# Patient Record
Sex: Female | Born: 1935 | Race: White | Hispanic: No | Marital: Single | Smoking: Never smoker
Health system: Southern US, Community
[De-identification: ages and names within clinical notes are randomized; demographics above are authoritative.]

## PROBLEM LIST (undated history)

## (undated) DIAGNOSIS — N261 Atrophy of kidney (terminal): Secondary | ICD-10-CM

## (undated) DIAGNOSIS — E039 Hypothyroidism, unspecified: Secondary | ICD-10-CM

## (undated) DIAGNOSIS — I10 Essential (primary) hypertension: Secondary | ICD-10-CM

## (undated) DIAGNOSIS — N951 Menopausal and female climacteric states: Secondary | ICD-10-CM

## (undated) DIAGNOSIS — C4491 Basal cell carcinoma of skin, unspecified: Secondary | ICD-10-CM

## (undated) DIAGNOSIS — C801 Malignant (primary) neoplasm, unspecified: Secondary | ICD-10-CM

## (undated) DIAGNOSIS — F329 Major depressive disorder, single episode, unspecified: Secondary | ICD-10-CM

## (undated) DIAGNOSIS — S42309A Unspecified fracture of shaft of humerus, unspecified arm, initial encounter for closed fracture: Secondary | ICD-10-CM

## (undated) DIAGNOSIS — K5792 Diverticulitis of intestine, part unspecified, without perforation or abscess without bleeding: Secondary | ICD-10-CM

## (undated) DIAGNOSIS — F419 Anxiety disorder, unspecified: Secondary | ICD-10-CM

## (undated) DIAGNOSIS — F32A Depression, unspecified: Secondary | ICD-10-CM

## (undated) DIAGNOSIS — I129 Hypertensive chronic kidney disease with stage 1 through stage 4 chronic kidney disease, or unspecified chronic kidney disease: Secondary | ICD-10-CM

## (undated) DIAGNOSIS — N19 Unspecified kidney failure: Secondary | ICD-10-CM

## (undated) HISTORY — DX: Depression, unspecified: F32.A

## (undated) HISTORY — PX: CATARACT EXTRACTION: SUR2

## (undated) HISTORY — DX: Atrophy of kidney (terminal): N26.1

## (undated) HISTORY — DX: Basal cell carcinoma of skin, unspecified: C44.91

## (undated) HISTORY — DX: Essential (primary) hypertension: I10

## (undated) HISTORY — DX: Hypothyroidism, unspecified: E03.9

## (undated) HISTORY — DX: Diverticulitis of intestine, part unspecified, without perforation or abscess without bleeding: K57.92

## (undated) HISTORY — DX: Unspecified kidney failure: N19

## (undated) HISTORY — DX: Malignant (primary) neoplasm, unspecified: C80.1

## (undated) HISTORY — DX: Hypertensive chronic kidney disease with stage 1 through stage 4 chronic kidney disease, or unspecified chronic kidney disease: I12.9

## (undated) HISTORY — DX: Anxiety disorder, unspecified: F41.9

## (undated) HISTORY — DX: Major depressive disorder, single episode, unspecified: F32.9

## (undated) HISTORY — DX: Menopausal and female climacteric states: N95.1

## (undated) HISTORY — PX: COLONOSCOPY: SHX174

## (undated) HISTORY — DX: Unspecified fracture of shaft of humerus, unspecified arm, initial encounter for closed fracture: S42.309A

---

## 2005-04-29 DIAGNOSIS — C801 Malignant (primary) neoplasm, unspecified: Secondary | ICD-10-CM

## 2005-04-29 HISTORY — PX: OTHER SURGICAL HISTORY: SHX169

## 2005-04-29 HISTORY — PX: PORTACATH PLACEMENT: SHX2246

## 2005-04-29 HISTORY — DX: Malignant (primary) neoplasm, unspecified: C80.1

## 2005-09-12 ENCOUNTER — Inpatient Hospital Stay: Payer: Self-pay | Admitting: General Surgery

## 2005-09-12 ENCOUNTER — Other Ambulatory Visit: Payer: Self-pay

## 2005-09-25 ENCOUNTER — Inpatient Hospital Stay: Payer: Self-pay | Admitting: General Surgery

## 2005-10-04 ENCOUNTER — Ambulatory Visit: Payer: Self-pay | Admitting: General Surgery

## 2005-10-09 ENCOUNTER — Ambulatory Visit: Payer: Self-pay | Admitting: Oncology

## 2005-10-14 ENCOUNTER — Ambulatory Visit: Payer: Self-pay | Admitting: General Surgery

## 2005-10-27 ENCOUNTER — Ambulatory Visit: Payer: Self-pay | Admitting: Oncology

## 2005-11-28 ENCOUNTER — Ambulatory Visit: Payer: Self-pay | Admitting: Oncology

## 2005-12-08 ENCOUNTER — Inpatient Hospital Stay: Payer: Self-pay | Admitting: Oncology

## 2005-12-28 ENCOUNTER — Ambulatory Visit: Payer: Self-pay | Admitting: Oncology

## 2006-01-27 ENCOUNTER — Ambulatory Visit: Payer: Self-pay | Admitting: Oncology

## 2006-02-27 ENCOUNTER — Ambulatory Visit: Payer: Self-pay | Admitting: Oncology

## 2006-03-29 ENCOUNTER — Ambulatory Visit: Payer: Self-pay | Admitting: Oncology

## 2006-04-29 ENCOUNTER — Ambulatory Visit: Payer: Self-pay | Admitting: Oncology

## 2006-05-30 ENCOUNTER — Ambulatory Visit: Payer: Self-pay | Admitting: Oncology

## 2006-06-28 ENCOUNTER — Ambulatory Visit: Payer: Self-pay | Admitting: Oncology

## 2006-08-19 ENCOUNTER — Ambulatory Visit: Payer: Self-pay | Admitting: Oncology

## 2006-08-19 ENCOUNTER — Ambulatory Visit: Payer: Self-pay | Admitting: Internal Medicine

## 2006-08-28 ENCOUNTER — Ambulatory Visit: Payer: Self-pay | Admitting: Internal Medicine

## 2006-08-28 ENCOUNTER — Ambulatory Visit: Payer: Self-pay | Admitting: Oncology

## 2006-09-09 ENCOUNTER — Ambulatory Visit: Payer: Self-pay | Admitting: General Surgery

## 2006-09-30 ENCOUNTER — Ambulatory Visit: Payer: Self-pay | Admitting: Oncology

## 2006-10-28 ENCOUNTER — Ambulatory Visit: Payer: Self-pay | Admitting: Oncology

## 2006-10-28 ENCOUNTER — Ambulatory Visit: Payer: Self-pay | Admitting: Internal Medicine

## 2006-11-28 ENCOUNTER — Ambulatory Visit: Payer: Self-pay | Admitting: Internal Medicine

## 2006-11-28 ENCOUNTER — Ambulatory Visit: Payer: Self-pay | Admitting: Oncology

## 2006-12-30 ENCOUNTER — Ambulatory Visit: Payer: Self-pay | Admitting: Oncology

## 2007-01-28 ENCOUNTER — Ambulatory Visit: Payer: Self-pay | Admitting: Oncology

## 2007-02-28 ENCOUNTER — Ambulatory Visit: Payer: Self-pay | Admitting: Oncology

## 2007-03-30 ENCOUNTER — Ambulatory Visit: Payer: Self-pay | Admitting: Oncology

## 2007-04-30 ENCOUNTER — Ambulatory Visit: Payer: Self-pay | Admitting: Oncology

## 2007-04-30 HISTORY — PX: PORT-A-CATH REMOVAL: SHX5289

## 2007-05-31 ENCOUNTER — Ambulatory Visit: Payer: Self-pay | Admitting: Oncology

## 2007-06-28 ENCOUNTER — Ambulatory Visit: Payer: Self-pay | Admitting: Oncology

## 2007-07-13 LAB — HM DEXA SCAN

## 2007-07-27 IMAGING — CR DG ABDOMEN 3V
1 series · 6 of 6 positions shown · non-contrast
Comparison: none

REASON FOR EXAM: Abdominal pain
COMMENTS:  LMP: Post-Menopausal

[Series 1: view not recorded · 0.17mm/px · 6 of 6 slices shown]
[im 1/6]
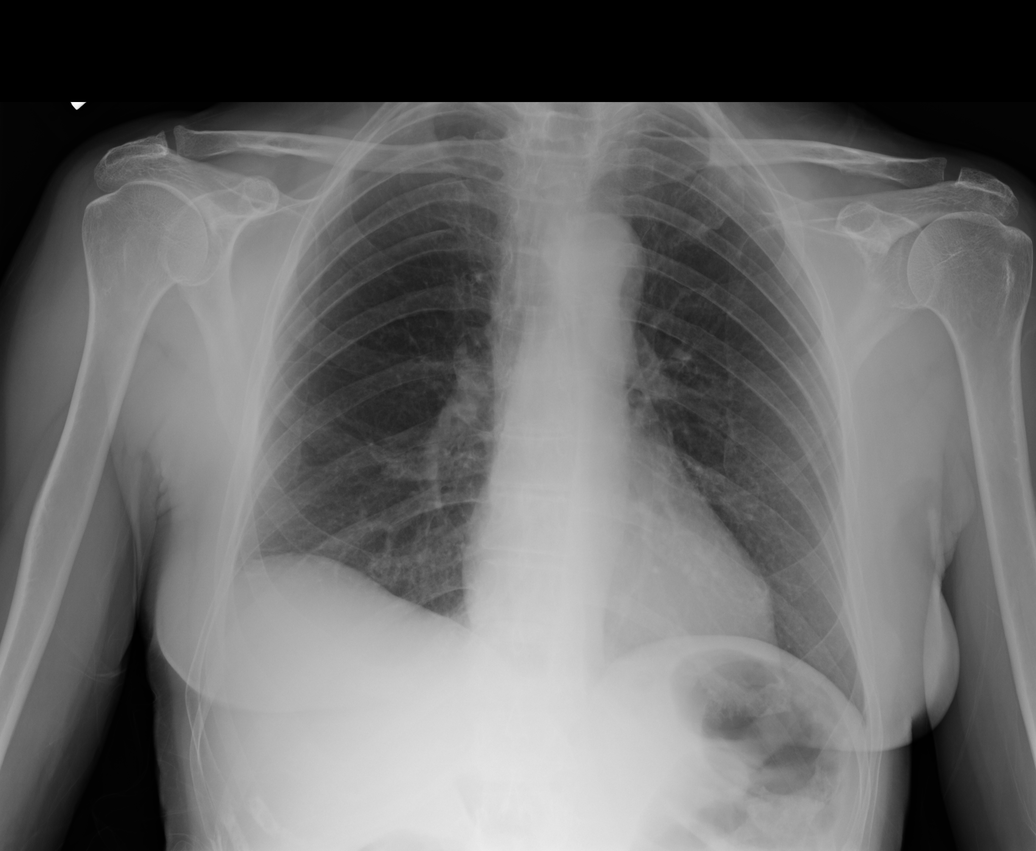
[im 2/6]
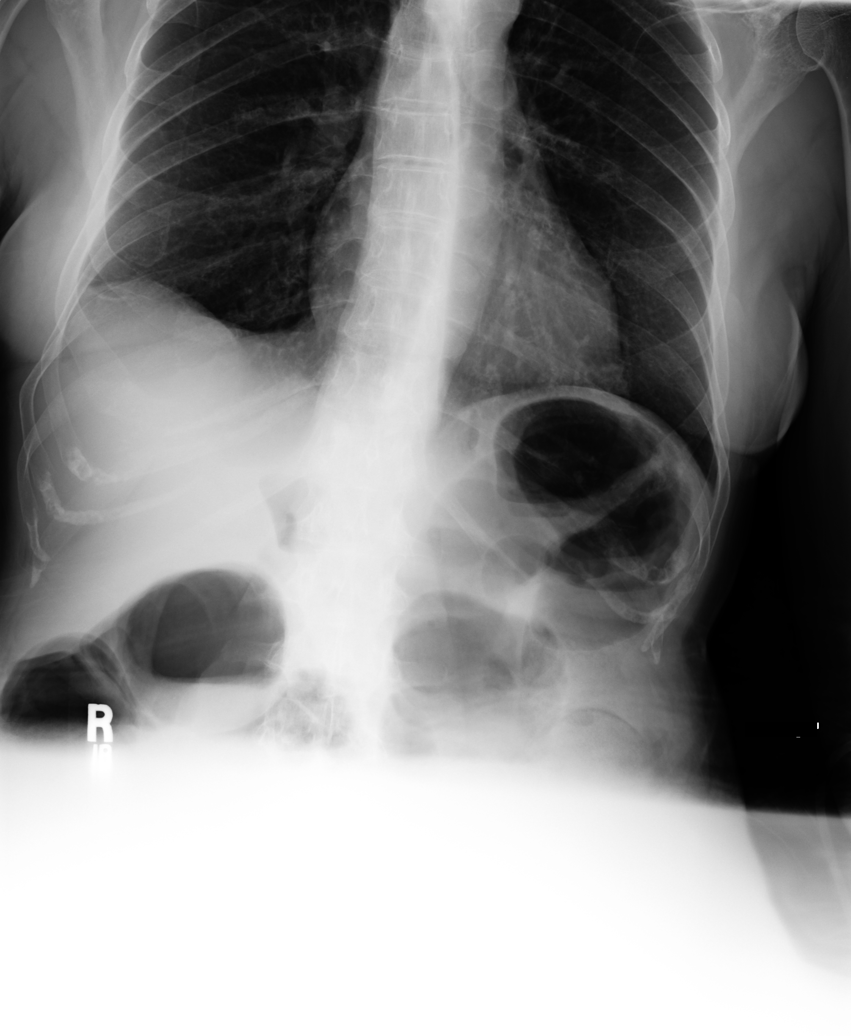
[im 3/6]
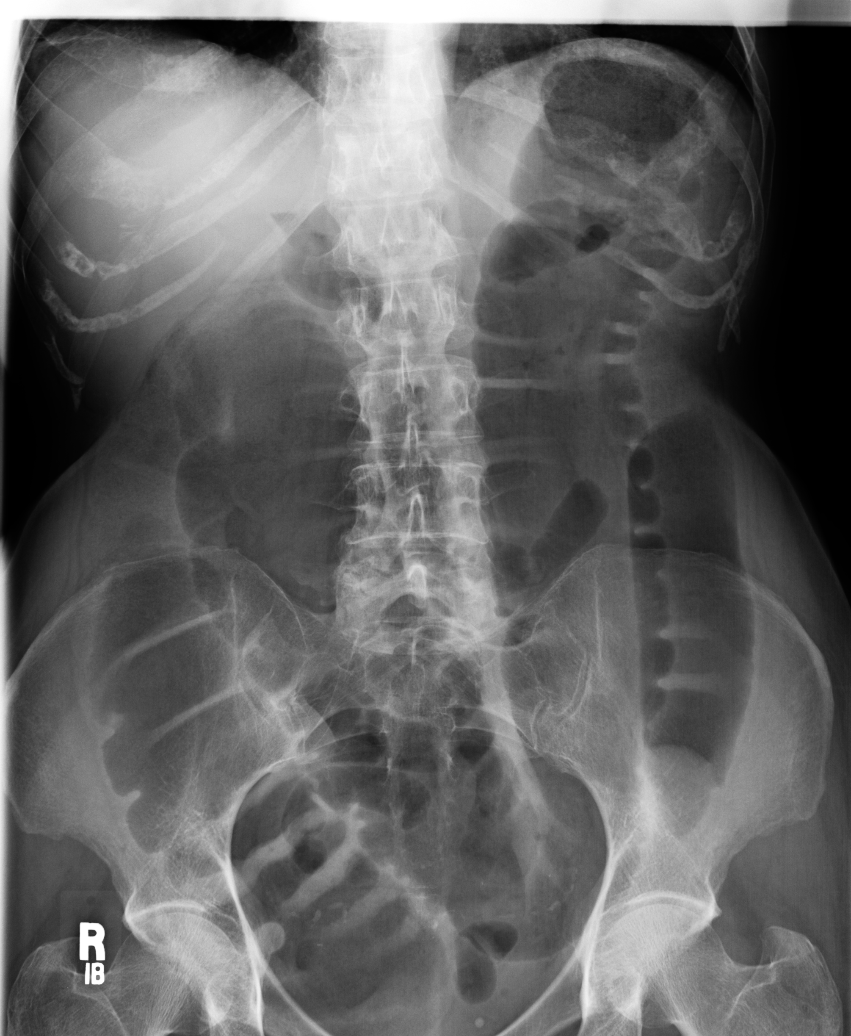
[im 4/6]
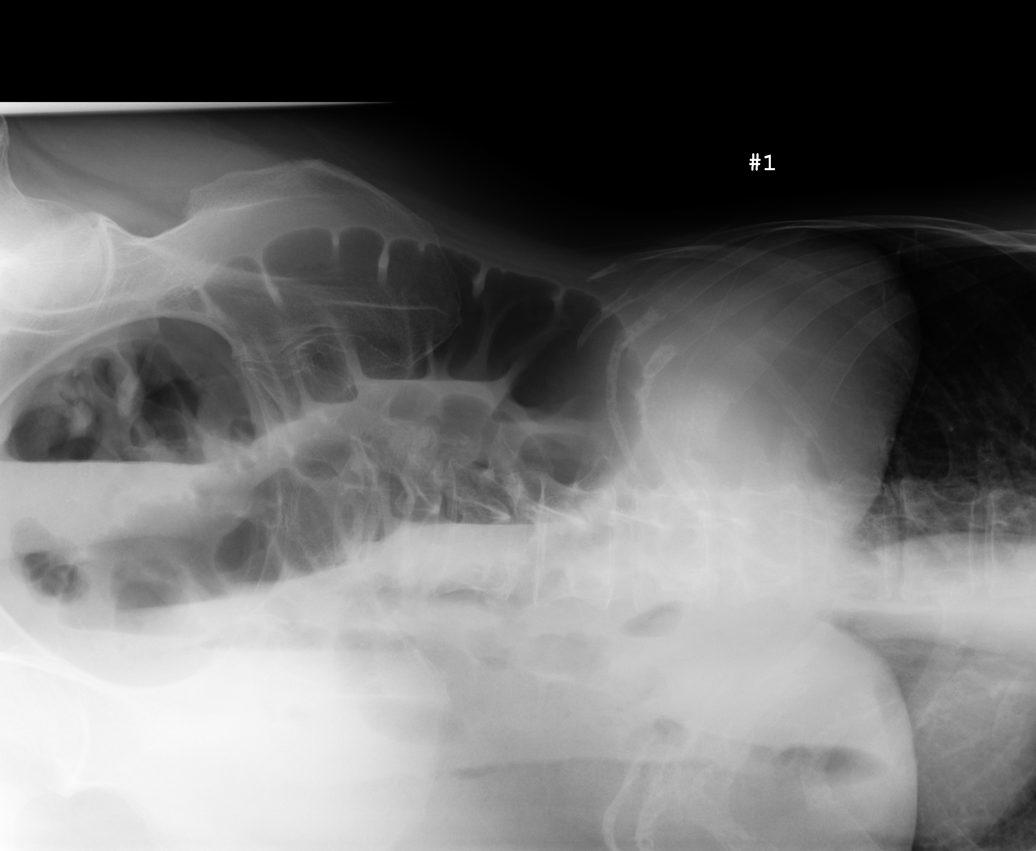
[im 5/6]
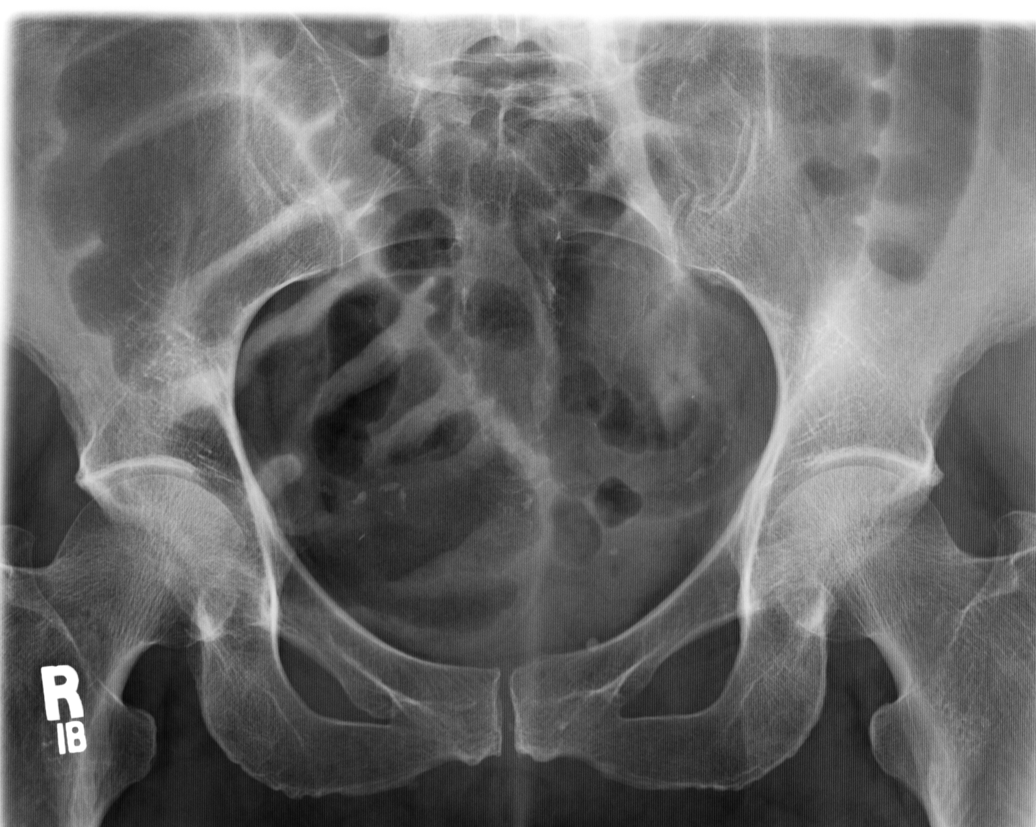
[im 6/6]
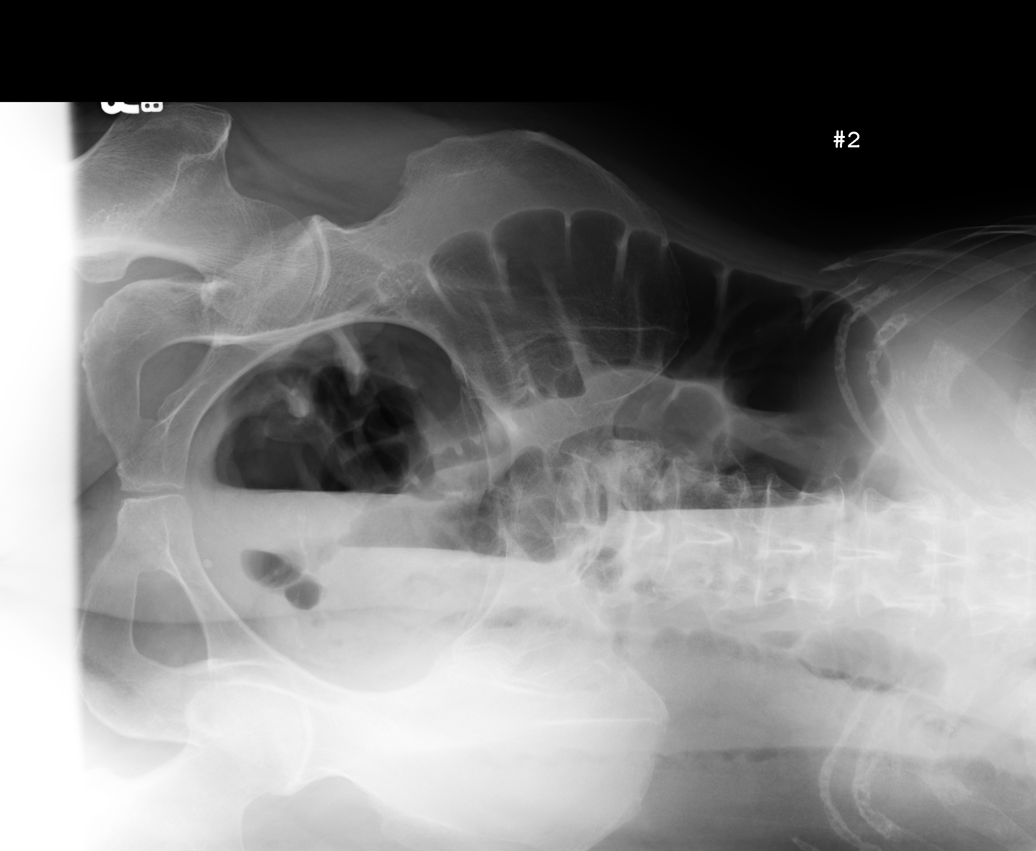

[6 of 6 positions shown; findings below may reference images not displayed]

PROCEDURE:     DXR - DXR ABDOMEN 3-WAY (INCL PA CXR)  - September 12, 2005  [DATE]

RESULT:        A single view of the chest shows mild elevation of the RIGHT
hemidiaphragm laterally.  Some air-fluid levels are seen within loops of
bowel that appear to be colon.  Small amount of air is seen in the rectum.
There appears to be decreased gas in the sigmoid relative to the rest of the
colon.  If the appearance does not resolve, then barium enema would be
recommended. Colonoscopy could also be considered.  No free air is seen.
IMPRESSION: Cannot exclude a low colonic partial obstruction.

## 2007-07-29 ENCOUNTER — Ambulatory Visit: Payer: Self-pay | Admitting: Oncology

## 2007-08-01 IMAGING — CR DG CHEST 1V PORT
1 series · 1 of 1 positions shown · non-contrast
Comparison: none

REASON FOR EXAM: hypoxia
COMMENTS:

[view not recorded]
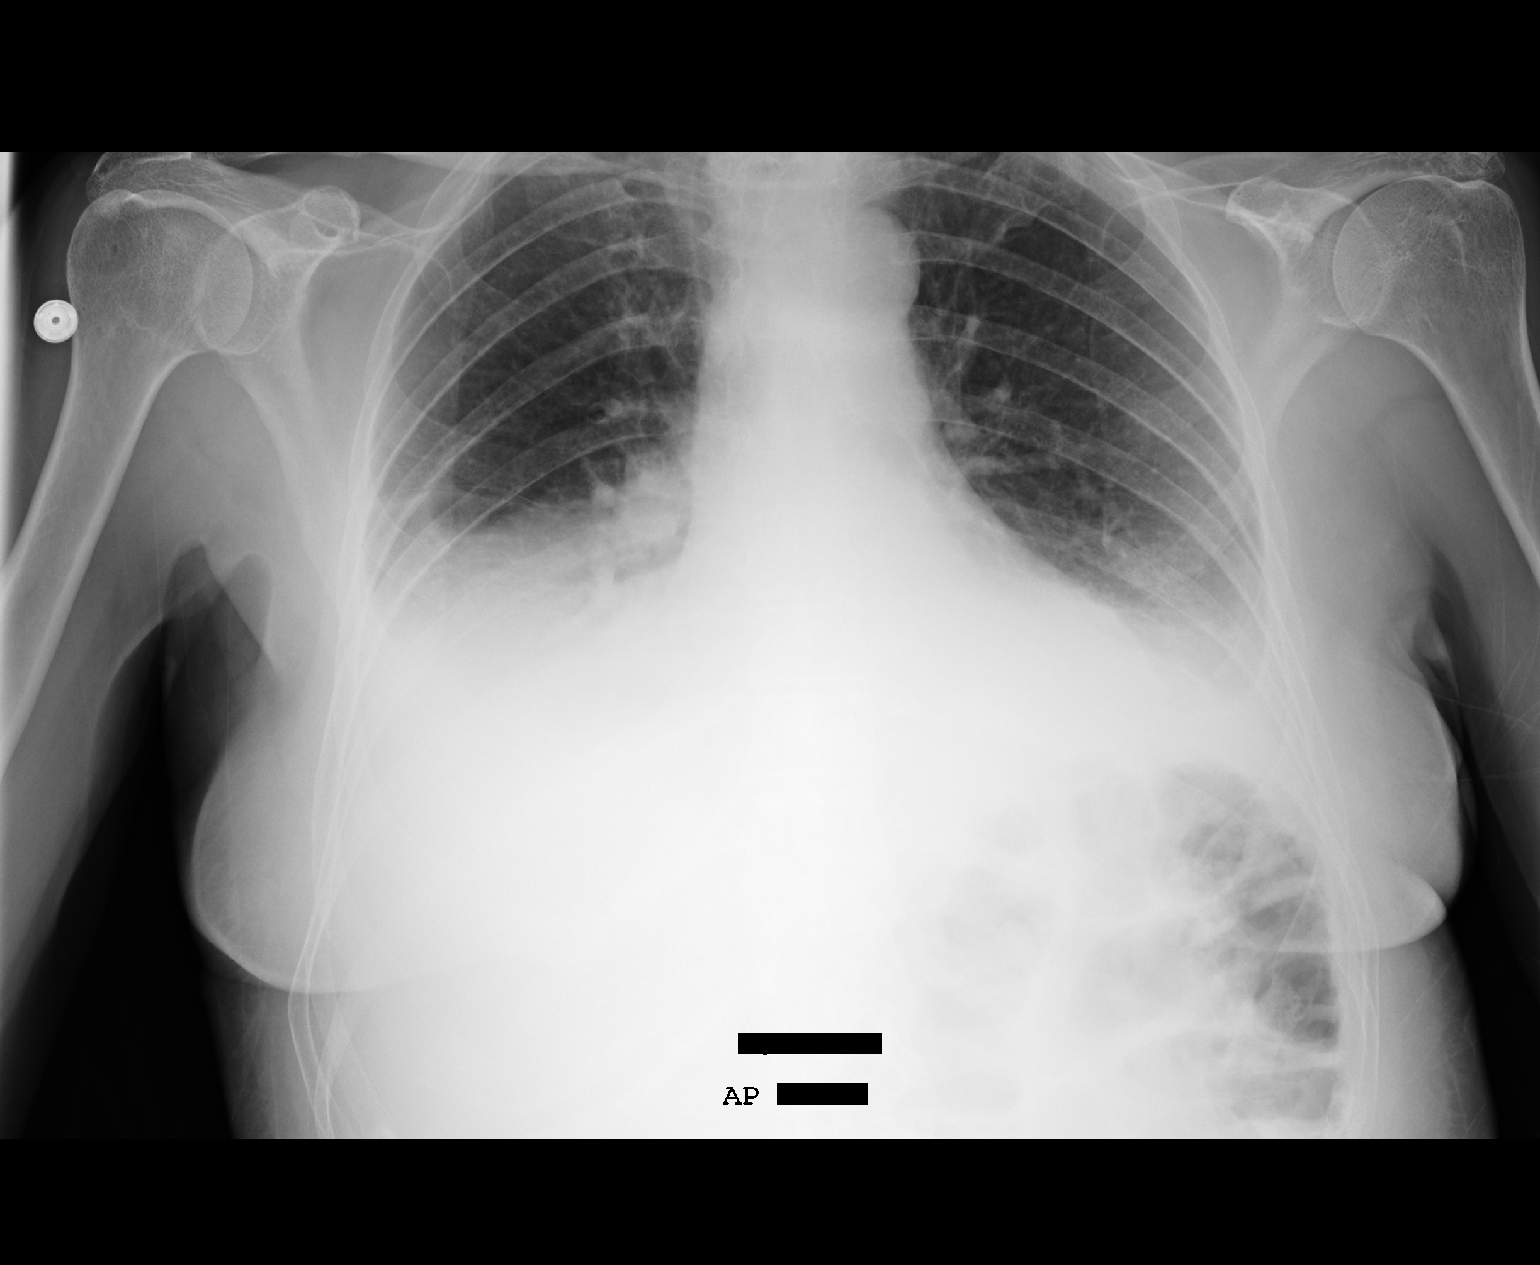

[1 of 1 positions shown; findings below may reference images not displayed]

PROCEDURE:     DXR - DXR PORTABLE CHEST SINGLE VIEW  - September 17, 2005 [DATE]

RESULT:     AP view of the chest shows increased density at both lung bases
compatible with bibasilar atelectasis with probable associated bibasilar
effusions. The effusions could be better evaluated by follow-up exam with a
lateral view.  The upper lobes are bilaterally clear.  Heart size is normal.
No pulmonary edema is seen.
IMPRESSION: 1)There is increased density at both lung bases, compatible with bibasilar
atelectasis.  Underlying effusions or underlying pneumonia cannot be
excluded. Continued follow-up is recommended.

## 2007-08-04 IMAGING — CR DG ABDOMEN 2V
1 series · 2 of 2 positions shown · non-contrast
Comparison: none

REASON FOR EXAM: Abdominal distension s/p sigmoid resection. PLEASE DO BY
10 AM. Thanks.
COMMENTS:

[Series 1: view not recorded · 0.17mm/px · 2 of 2 slices shown]
[im 1/2]
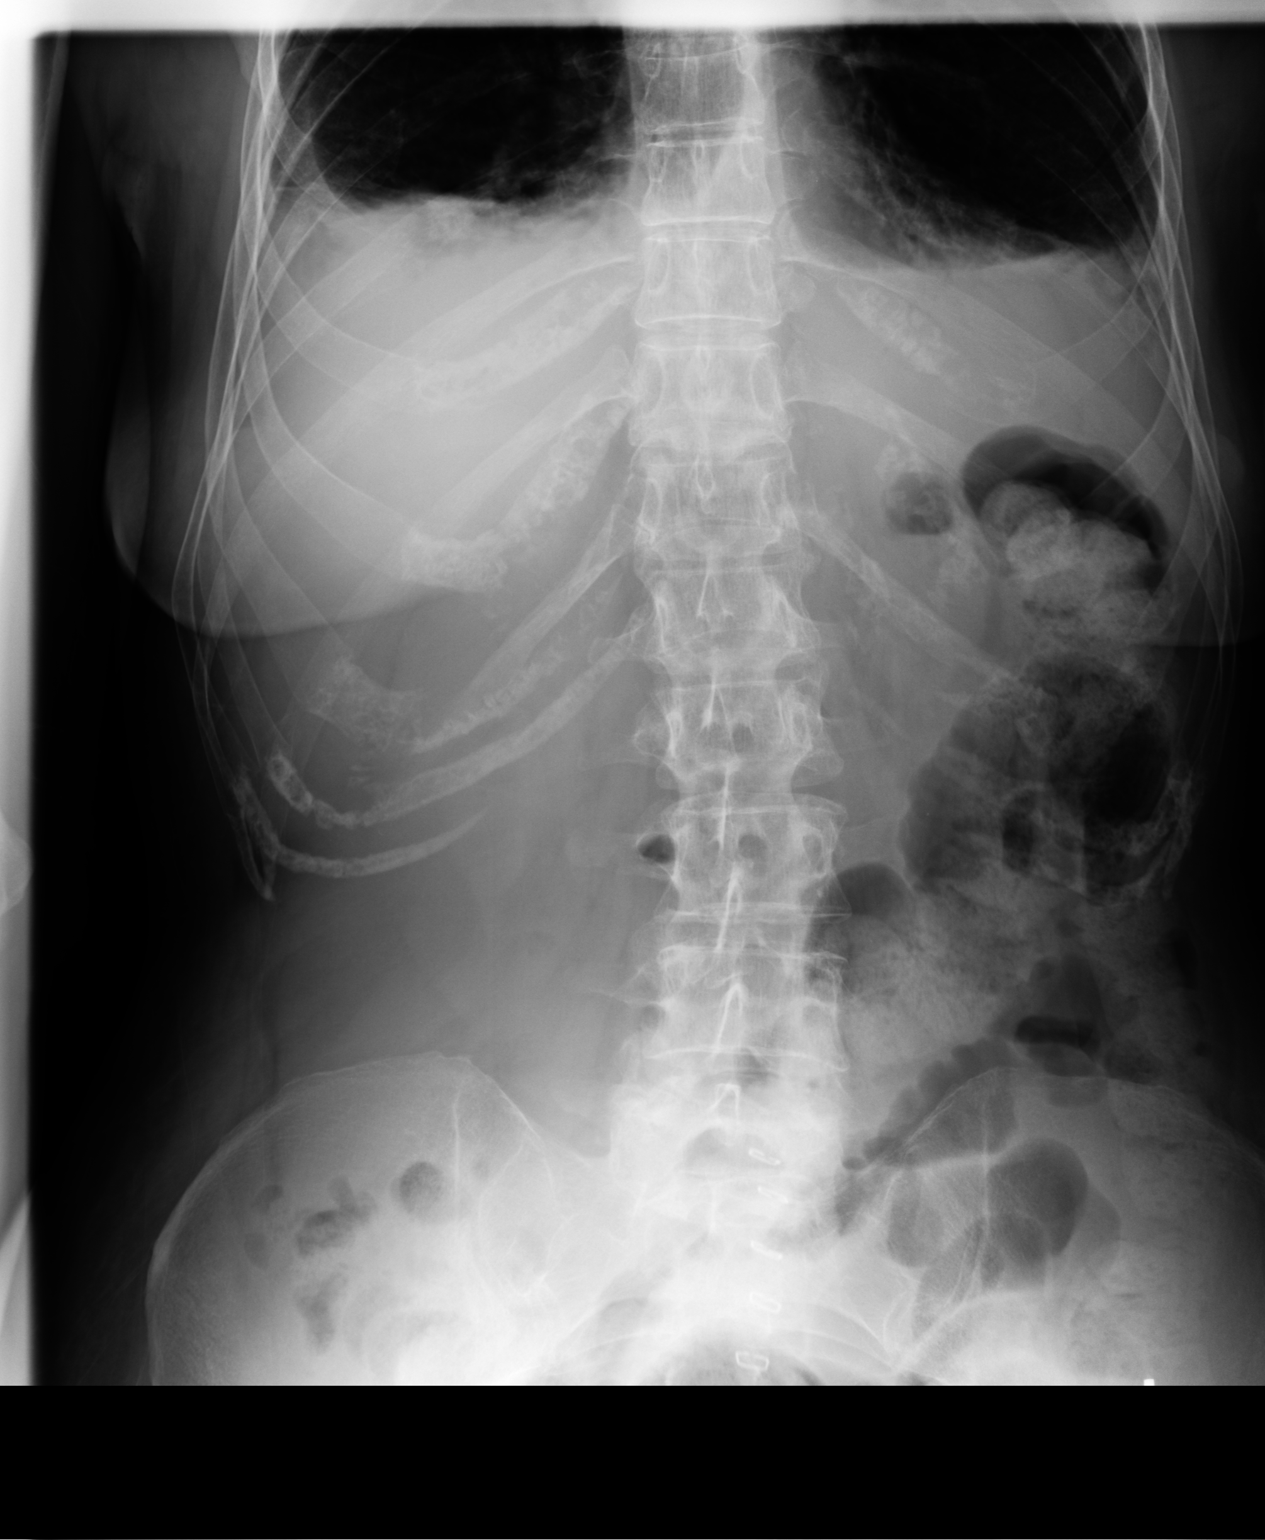
[im 2/2]
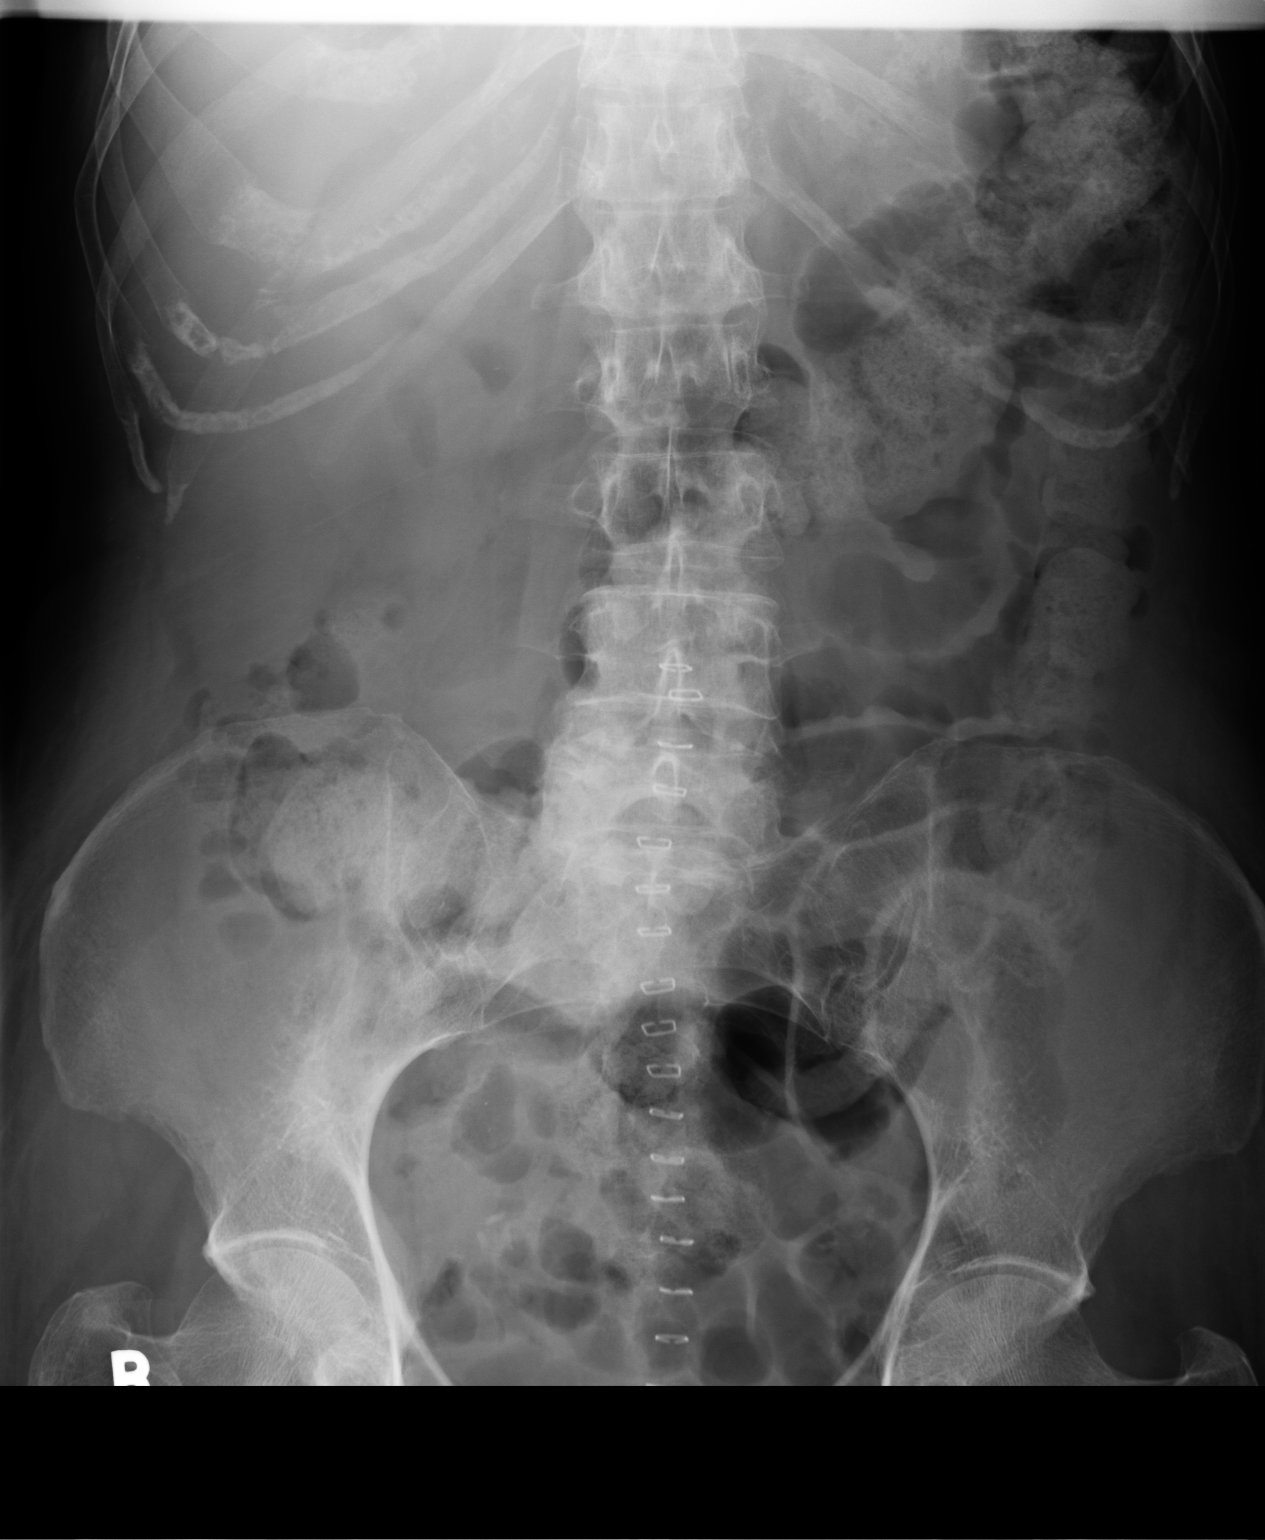

[2 of 2 positions shown; findings below may reference images not displayed]

PROCEDURE:     DXR - DXR ABDOMEN 2 V FLAT AND ERECT  - September 20, 2005  [DATE]

RESULT:     Flat and erect views of the abdomen are compared to a prior exam
of 09-13-05.  The current exam shows midline pelvic metallic wire sutures.
The bowel gas pattern on the current exam is normal in appearance. The bowel
dilatation present on the prior exam of 09-13-05 is no longer seen.  Fecal
material is noted in the colon.  No abnormal intraabdominal calcifications
are seen.
IMPRESSION: 1)No specific abnormalities are noted.

## 2007-08-10 IMAGING — US US GUIDE NEEDLE - US [PERSON_NAME]
1 series · 8 of 8 positions shown · non-contrast
Comparison: none

REASON FOR EXAM: Right pleural effusion. Send for culture, cell count,
glucose, LDH, gram stain.
COMMENTS:

PROCEDURE:     US  - US GUIDED THORACENTESIS RIGHT  - September 26, 2005 [DATE]
RESULT:
HISTORY: RIGHT pleural effusion.

[Series 1: us guide needle - us (person_name) · 0.38mm/px · 8 of 8 slices shown]
[im 1/8]
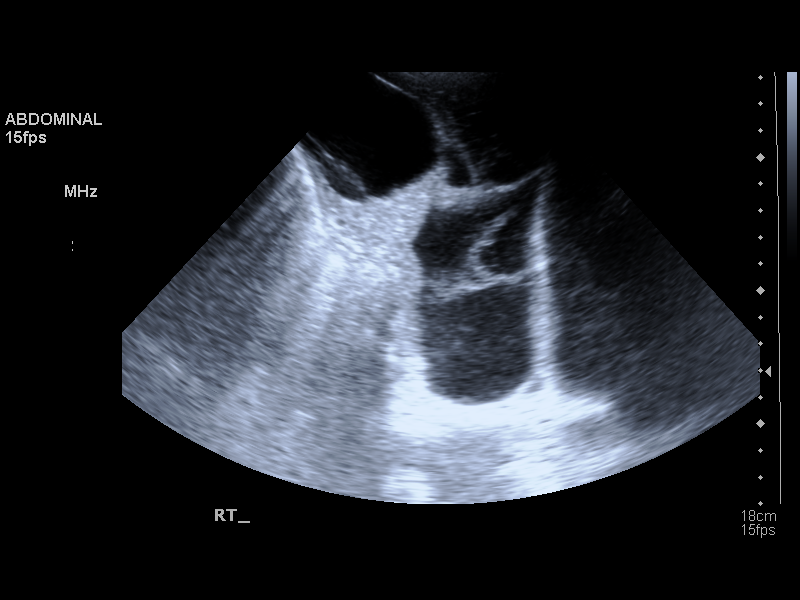
[im 2/8]
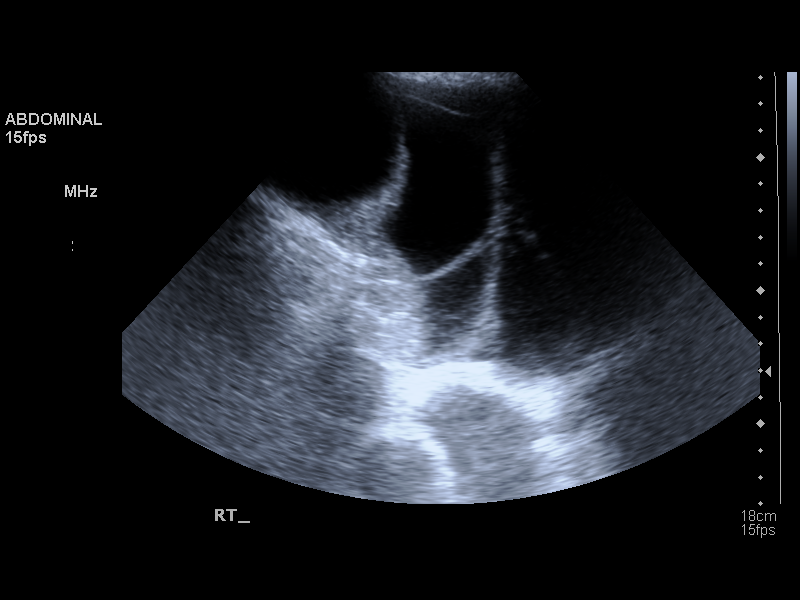
[im 3/8]
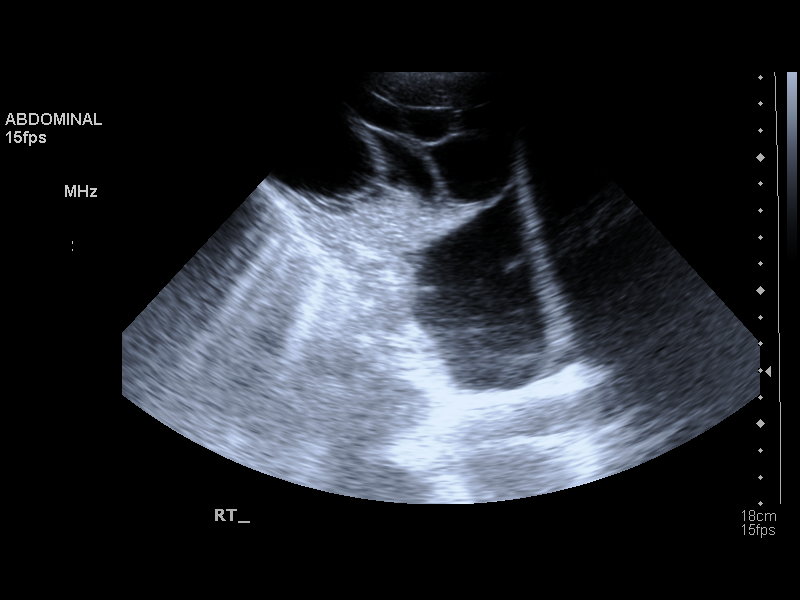
[im 4/8]
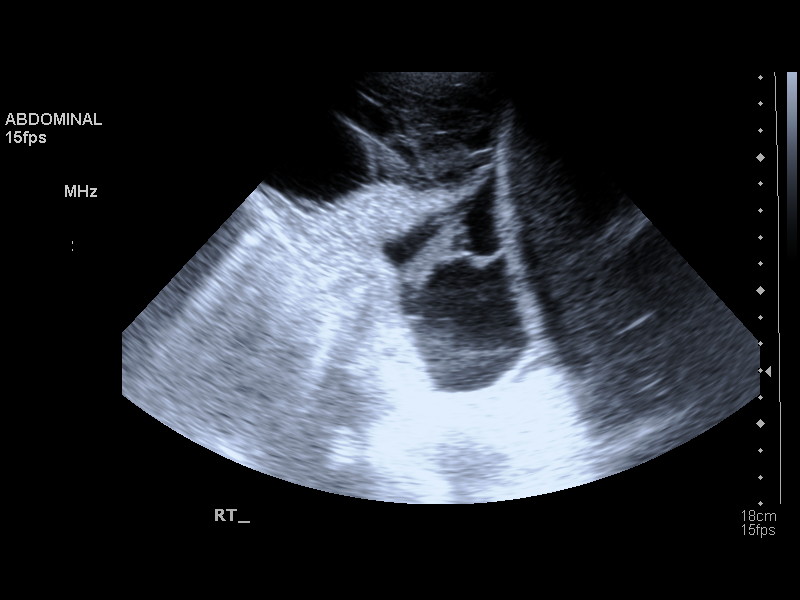
[im 5/8]
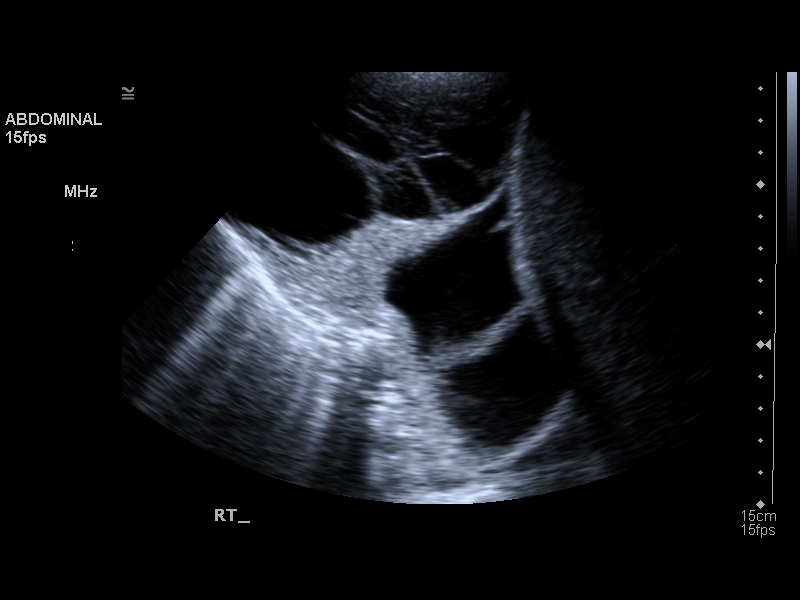
[im 6/8]
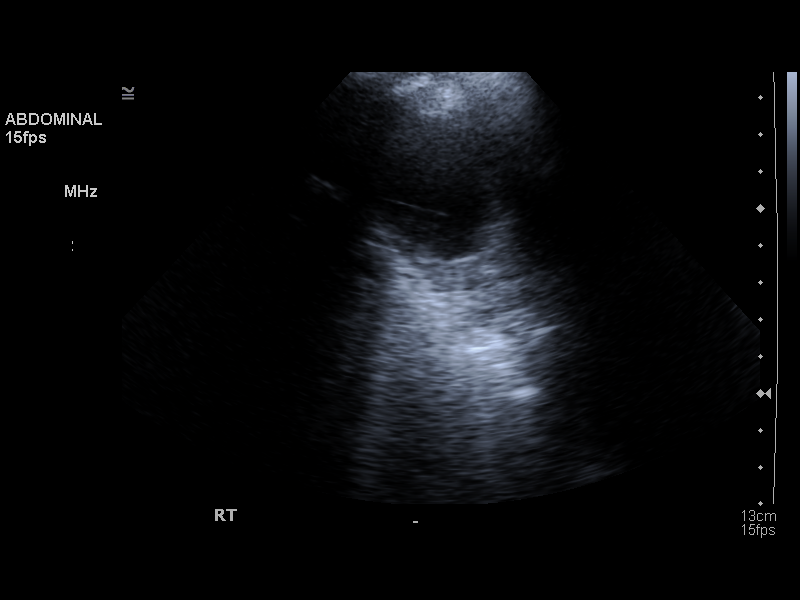
[im 7/8]
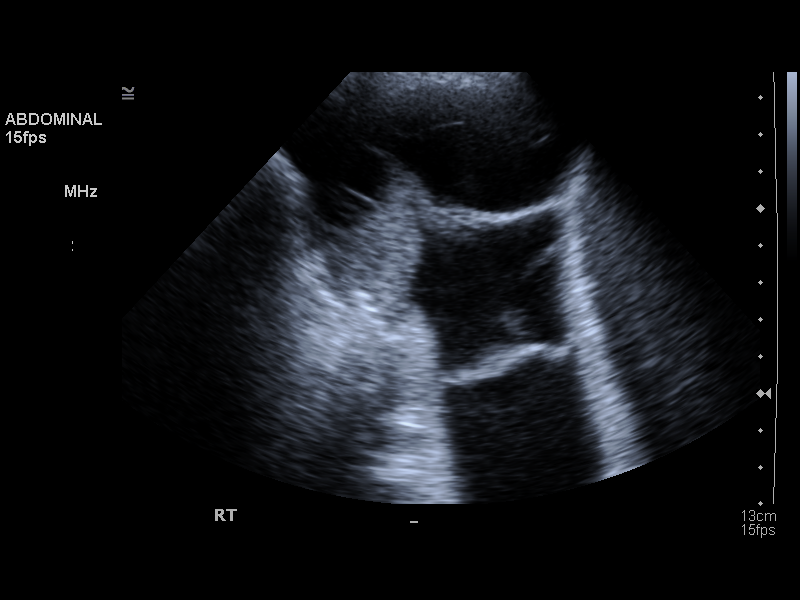
[im 8/8]
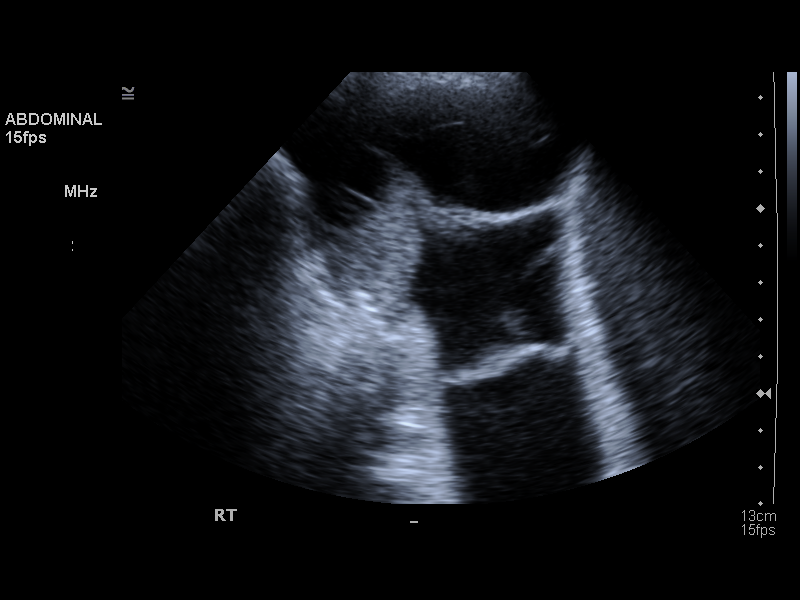

[8 of 8 positions shown; findings below may reference images not displayed]

PROCEDURE AND FINDINGS:   Ultrasound was performed and reveals a severely
loculated thick septated fluid collection in the RIGHT pleural space.  The
patient was sterilely prepped and draped.  Local anesthesia was administered
with 1% Lidocaine.  The largest collection of fluid was localized and a
14-gauge Jelco needle was advanced into the fluid collection and 200 ml of
serosanguineous fluid removed.
IMPRESSION: 1.     Successful ultrasound-directed thoracentesis.  Fluid sample sent to
the ordered labs.
2.     It should be noted that the pleural fluid collection is extremely
loculated with multiple thick septations.

## 2007-08-28 ENCOUNTER — Ambulatory Visit: Payer: Self-pay | Admitting: Oncology

## 2007-09-29 ENCOUNTER — Ambulatory Visit: Payer: Self-pay | Admitting: Oncology

## 2007-10-28 ENCOUNTER — Ambulatory Visit: Payer: Self-pay | Admitting: Oncology

## 2007-11-19 ENCOUNTER — Ambulatory Visit: Payer: Self-pay | Admitting: Oncology

## 2007-11-28 ENCOUNTER — Ambulatory Visit: Payer: Self-pay | Admitting: Oncology

## 2008-01-07 ENCOUNTER — Ambulatory Visit: Payer: Self-pay | Admitting: Oncology

## 2008-01-28 ENCOUNTER — Ambulatory Visit: Payer: Self-pay | Admitting: Oncology

## 2008-02-28 ENCOUNTER — Ambulatory Visit: Payer: Self-pay | Admitting: Oncology

## 2008-03-29 ENCOUNTER — Ambulatory Visit: Payer: Self-pay | Admitting: Oncology

## 2008-07-28 ENCOUNTER — Ambulatory Visit: Payer: Self-pay | Admitting: Oncology

## 2008-08-27 ENCOUNTER — Ambulatory Visit: Payer: Self-pay | Admitting: Oncology

## 2009-01-27 ENCOUNTER — Ambulatory Visit: Payer: Self-pay | Admitting: Nurse Practitioner

## 2009-02-02 ENCOUNTER — Ambulatory Visit: Payer: Self-pay | Admitting: Oncology

## 2009-02-27 ENCOUNTER — Ambulatory Visit: Payer: Self-pay | Admitting: Oncology

## 2009-02-27 ENCOUNTER — Ambulatory Visit: Payer: Self-pay | Admitting: Nurse Practitioner

## 2009-04-12 ENCOUNTER — Ambulatory Visit: Payer: Self-pay | Admitting: Oncology

## 2009-04-29 ENCOUNTER — Ambulatory Visit: Payer: Self-pay | Admitting: Oncology

## 2009-06-13 ENCOUNTER — Ambulatory Visit: Payer: Self-pay | Admitting: Ophthalmology

## 2009-06-26 ENCOUNTER — Ambulatory Visit: Payer: Self-pay | Admitting: Ophthalmology

## 2009-07-28 ENCOUNTER — Ambulatory Visit: Payer: Self-pay | Admitting: Oncology

## 2009-08-03 ENCOUNTER — Ambulatory Visit: Payer: Self-pay | Admitting: Oncology

## 2009-08-11 ENCOUNTER — Ambulatory Visit: Payer: Self-pay | Admitting: General Surgery

## 2009-08-27 ENCOUNTER — Ambulatory Visit: Payer: Self-pay | Admitting: Oncology

## 2009-10-09 ENCOUNTER — Ambulatory Visit: Payer: Self-pay | Admitting: Ophthalmology

## 2010-01-27 ENCOUNTER — Ambulatory Visit: Payer: Self-pay | Admitting: Oncology

## 2010-02-08 ENCOUNTER — Ambulatory Visit: Payer: Self-pay | Admitting: Oncology

## 2010-02-10 LAB — CEA: CEA: 3.4 ng/mL (ref 0.0–4.7)

## 2010-02-27 ENCOUNTER — Ambulatory Visit: Payer: Self-pay | Admitting: Oncology

## 2010-08-20 ENCOUNTER — Ambulatory Visit: Payer: Self-pay | Admitting: Oncology

## 2010-08-21 LAB — CEA: CEA: 3.3 ng/mL (ref 0.0–4.7)

## 2010-08-28 ENCOUNTER — Ambulatory Visit: Payer: Self-pay | Admitting: Oncology

## 2011-02-19 ENCOUNTER — Ambulatory Visit: Payer: Self-pay | Admitting: Oncology

## 2011-02-28 ENCOUNTER — Ambulatory Visit: Payer: Self-pay | Admitting: Oncology

## 2011-07-19 ENCOUNTER — Observation Stay: Payer: Self-pay | Admitting: Internal Medicine

## 2011-07-19 DIAGNOSIS — E039 Hypothyroidism, unspecified: Secondary | ICD-10-CM | POA: Diagnosis not present

## 2011-07-19 DIAGNOSIS — R079 Chest pain, unspecified: Secondary | ICD-10-CM | POA: Diagnosis not present

## 2011-07-19 DIAGNOSIS — Z79899 Other long term (current) drug therapy: Secondary | ICD-10-CM | POA: Diagnosis not present

## 2011-07-19 DIAGNOSIS — N179 Acute kidney failure, unspecified: Secondary | ICD-10-CM | POA: Diagnosis not present

## 2011-07-19 DIAGNOSIS — I519 Heart disease, unspecified: Secondary | ICD-10-CM | POA: Diagnosis not present

## 2011-07-19 DIAGNOSIS — Z8249 Family history of ischemic heart disease and other diseases of the circulatory system: Secondary | ICD-10-CM | POA: Diagnosis not present

## 2011-07-19 DIAGNOSIS — D72829 Elevated white blood cell count, unspecified: Secondary | ICD-10-CM | POA: Diagnosis not present

## 2011-07-19 DIAGNOSIS — Z9049 Acquired absence of other specified parts of digestive tract: Secondary | ICD-10-CM | POA: Diagnosis not present

## 2011-07-19 DIAGNOSIS — F17200 Nicotine dependence, unspecified, uncomplicated: Secondary | ICD-10-CM | POA: Diagnosis not present

## 2011-07-19 DIAGNOSIS — E860 Dehydration: Secondary | ICD-10-CM | POA: Diagnosis not present

## 2011-07-19 DIAGNOSIS — R52 Pain, unspecified: Secondary | ICD-10-CM | POA: Diagnosis not present

## 2011-07-19 DIAGNOSIS — D649 Anemia, unspecified: Secondary | ICD-10-CM | POA: Diagnosis not present

## 2011-07-19 DIAGNOSIS — I959 Hypotension, unspecified: Secondary | ICD-10-CM | POA: Diagnosis not present

## 2011-07-19 DIAGNOSIS — I10 Essential (primary) hypertension: Secondary | ICD-10-CM | POA: Diagnosis not present

## 2011-07-19 DIAGNOSIS — R0789 Other chest pain: Secondary | ICD-10-CM | POA: Diagnosis not present

## 2011-07-19 DIAGNOSIS — Z85038 Personal history of other malignant neoplasm of large intestine: Secondary | ICD-10-CM | POA: Diagnosis not present

## 2011-07-19 DIAGNOSIS — F411 Generalized anxiety disorder: Secondary | ICD-10-CM | POA: Diagnosis not present

## 2011-07-19 LAB — CBC
HCT: 36.4 % (ref 35.0–47.0)
MCHC: 34.3 g/dL (ref 32.0–36.0)
Platelet: 156 10*3/uL (ref 150–440)
RBC: 3.86 10*6/uL (ref 3.80–5.20)
RDW: 12.8 % (ref 11.5–14.5)
WBC: 17.4 10*3/uL — ABNORMAL HIGH (ref 3.6–11.0)

## 2011-07-19 LAB — PRO B NATRIURETIC PEPTIDE: B-Type Natriuretic Peptide: 96 pg/mL (ref 0–450)

## 2011-07-19 LAB — CK TOTAL AND CKMB (NOT AT ARMC)
CK, Total: 54 U/L (ref 21–215)
CK-MB: 0.6 ng/mL (ref 0.5–3.6)

## 2011-07-19 LAB — COMPREHENSIVE METABOLIC PANEL
Alkaline Phosphatase: 60 U/L (ref 50–136)
BUN: 21 mg/dL — ABNORMAL HIGH (ref 7–18)
Bilirubin,Total: 0.6 mg/dL (ref 0.2–1.0)
Co2: 24 mmol/L (ref 21–32)
EGFR (African American): 54 — ABNORMAL LOW
EGFR (Non-African Amer.): 45 — ABNORMAL LOW
Glucose: 128 mg/dL — ABNORMAL HIGH (ref 65–99)
Osmolality: 280 (ref 275–301)
Potassium: 4 mmol/L (ref 3.5–5.1)
SGOT(AST): 21 U/L (ref 15–37)
SGPT (ALT): 21 U/L
Total Protein: 7.9 g/dL (ref 6.4–8.2)

## 2011-07-20 DIAGNOSIS — R072 Precordial pain: Secondary | ICD-10-CM | POA: Diagnosis not present

## 2011-07-20 DIAGNOSIS — D72829 Elevated white blood cell count, unspecified: Secondary | ICD-10-CM | POA: Diagnosis not present

## 2011-07-20 DIAGNOSIS — I959 Hypotension, unspecified: Secondary | ICD-10-CM | POA: Diagnosis not present

## 2011-07-20 DIAGNOSIS — R0789 Other chest pain: Secondary | ICD-10-CM | POA: Diagnosis not present

## 2011-07-20 DIAGNOSIS — N179 Acute kidney failure, unspecified: Secondary | ICD-10-CM | POA: Diagnosis not present

## 2011-07-20 LAB — CBC WITH DIFFERENTIAL/PLATELET
Basophil %: 0.1 %
Eosinophil %: 0.8 %
Lymphocyte #: 2 10*3/uL (ref 1.0–3.6)
Lymphocyte %: 19.4 %
MCH: 32.3 pg (ref 26.0–34.0)
MCHC: 34.3 g/dL (ref 32.0–36.0)
MCV: 94 fL (ref 80–100)
Monocyte %: 11.6 %
Platelet: 151 10*3/uL (ref 150–440)
RDW: 13.2 % (ref 11.5–14.5)
WBC: 10.1 10*3/uL (ref 3.6–11.0)

## 2011-07-20 LAB — BASIC METABOLIC PANEL
Anion Gap: 13 (ref 7–16)
BUN: 24 mg/dL — ABNORMAL HIGH (ref 7–18)
Calcium, Total: 8.2 mg/dL — ABNORMAL LOW (ref 8.5–10.1)
Chloride: 108 mmol/L — ABNORMAL HIGH (ref 98–107)
Co2: 21 mmol/L (ref 21–32)
Creatinine: 1.32 mg/dL — ABNORMAL HIGH (ref 0.60–1.30)
Osmolality: 286 (ref 275–301)
Potassium: 4.2 mmol/L (ref 3.5–5.1)
Sodium: 142 mmol/L (ref 136–145)

## 2011-07-20 LAB — LIPID PANEL: VLDL Cholesterol, Calc: 9 mg/dL (ref 5–40)

## 2011-07-20 LAB — TROPONIN I: Troponin-I: <0.02 ng/mL

## 2011-07-30 DIAGNOSIS — I10 Essential (primary) hypertension: Secondary | ICD-10-CM | POA: Diagnosis not present

## 2011-07-30 DIAGNOSIS — R5383 Other fatigue: Secondary | ICD-10-CM | POA: Diagnosis not present

## 2011-07-30 DIAGNOSIS — R5381 Other malaise: Secondary | ICD-10-CM | POA: Diagnosis not present

## 2011-08-01 DIAGNOSIS — H3530 Unspecified macular degeneration: Secondary | ICD-10-CM | POA: Diagnosis not present

## 2011-08-15 DIAGNOSIS — K5732 Diverticulitis of large intestine without perforation or abscess without bleeding: Secondary | ICD-10-CM | POA: Diagnosis not present

## 2011-08-15 DIAGNOSIS — I10 Essential (primary) hypertension: Secondary | ICD-10-CM | POA: Diagnosis not present

## 2011-08-15 DIAGNOSIS — E039 Hypothyroidism, unspecified: Secondary | ICD-10-CM | POA: Diagnosis not present

## 2011-08-15 DIAGNOSIS — F411 Generalized anxiety disorder: Secondary | ICD-10-CM | POA: Diagnosis not present

## 2011-10-17 DIAGNOSIS — L821 Other seborrheic keratosis: Secondary | ICD-10-CM | POA: Diagnosis not present

## 2011-10-17 DIAGNOSIS — W57XXXA Bitten or stung by nonvenomous insect and other nonvenomous arthropods, initial encounter: Secondary | ICD-10-CM | POA: Diagnosis not present

## 2011-10-17 DIAGNOSIS — S30860A Insect bite (nonvenomous) of lower back and pelvis, initial encounter: Secondary | ICD-10-CM | POA: Diagnosis not present

## 2012-02-19 ENCOUNTER — Ambulatory Visit: Payer: Self-pay | Admitting: Oncology

## 2012-02-19 DIAGNOSIS — R109 Unspecified abdominal pain: Secondary | ICD-10-CM | POA: Diagnosis not present

## 2012-02-19 DIAGNOSIS — Z85038 Personal history of other malignant neoplasm of large intestine: Secondary | ICD-10-CM | POA: Diagnosis not present

## 2012-02-19 DIAGNOSIS — Z9221 Personal history of antineoplastic chemotherapy: Secondary | ICD-10-CM | POA: Diagnosis not present

## 2012-02-19 DIAGNOSIS — K5730 Diverticulosis of large intestine without perforation or abscess without bleeding: Secondary | ICD-10-CM | POA: Diagnosis not present

## 2012-02-19 DIAGNOSIS — I10 Essential (primary) hypertension: Secondary | ICD-10-CM | POA: Diagnosis not present

## 2012-02-19 DIAGNOSIS — F411 Generalized anxiety disorder: Secondary | ICD-10-CM | POA: Diagnosis not present

## 2012-02-19 DIAGNOSIS — G609 Hereditary and idiopathic neuropathy, unspecified: Secondary | ICD-10-CM | POA: Diagnosis not present

## 2012-02-19 DIAGNOSIS — Z23 Encounter for immunization: Secondary | ICD-10-CM | POA: Diagnosis not present

## 2012-02-19 DIAGNOSIS — F17200 Nicotine dependence, unspecified, uncomplicated: Secondary | ICD-10-CM | POA: Diagnosis not present

## 2012-02-19 DIAGNOSIS — E039 Hypothyroidism, unspecified: Secondary | ICD-10-CM | POA: Diagnosis not present

## 2012-02-19 DIAGNOSIS — Z79899 Other long term (current) drug therapy: Secondary | ICD-10-CM | POA: Diagnosis not present

## 2012-02-19 LAB — CBC CANCER CENTER
Basophil #: 0.1 x10 3/mm (ref 0.0–0.1)
Basophil %: 1 %
Eosinophil #: 0.2 x10 3/mm (ref 0.0–0.7)
Eosinophil %: 2 %
HCT: 36.3 % (ref 35.0–47.0)
Lymphocyte #: 1.9 x10 3/mm (ref 1.0–3.6)
Lymphocyte %: 25.3 %
MCV: 96 fL (ref 80–100)
Monocyte #: 0.7 x10 3/mm (ref 0.2–0.9)
Monocyte %: 9.1 %
Neutrophil %: 62.6 %
Platelet: 158 x10 3/mm (ref 150–440)
RBC: 3.79 10*6/uL — ABNORMAL LOW (ref 3.80–5.20)
WBC: 7.6 x10 3/mm (ref 3.6–11.0)

## 2012-02-19 LAB — COMPREHENSIVE METABOLIC PANEL
Albumin: 3.7 g/dL (ref 3.4–5.0)
Anion Gap: 9 (ref 7–16)
BUN: 20 mg/dL — ABNORMAL HIGH (ref 7–18)
Calcium, Total: 9.2 mg/dL (ref 8.5–10.1)
Chloride: 105 mmol/L (ref 98–107)
Co2: 25 mmol/L (ref 21–32)
EGFR (African American): 46 — ABNORMAL LOW
Osmolality: 282 (ref 275–301)
SGOT(AST): 26 U/L (ref 15–37)
SGPT (ALT): 31 U/L (ref 12–78)

## 2012-02-20 LAB — CEA: CEA: 3.9 ng/mL (ref 0.0–4.7)

## 2012-02-26 DIAGNOSIS — H43819 Vitreous degeneration, unspecified eye: Secondary | ICD-10-CM | POA: Diagnosis not present

## 2012-02-28 ENCOUNTER — Ambulatory Visit: Payer: Self-pay | Admitting: Oncology

## 2012-04-07 DIAGNOSIS — Z1231 Encounter for screening mammogram for malignant neoplasm of breast: Secondary | ICD-10-CM | POA: Diagnosis not present

## 2012-08-24 DIAGNOSIS — I10 Essential (primary) hypertension: Secondary | ICD-10-CM | POA: Diagnosis not present

## 2012-08-24 DIAGNOSIS — F17200 Nicotine dependence, unspecified, uncomplicated: Secondary | ICD-10-CM | POA: Diagnosis not present

## 2012-08-24 DIAGNOSIS — F329 Major depressive disorder, single episode, unspecified: Secondary | ICD-10-CM | POA: Diagnosis not present

## 2012-08-24 DIAGNOSIS — G4700 Insomnia, unspecified: Secondary | ICD-10-CM | POA: Diagnosis not present

## 2012-08-24 DIAGNOSIS — E039 Hypothyroidism, unspecified: Secondary | ICD-10-CM | POA: Diagnosis not present

## 2012-08-25 DIAGNOSIS — H2510 Age-related nuclear cataract, unspecified eye: Secondary | ICD-10-CM | POA: Diagnosis not present

## 2012-09-22 DIAGNOSIS — E875 Hyperkalemia: Secondary | ICD-10-CM | POA: Diagnosis not present

## 2012-09-22 DIAGNOSIS — R319 Hematuria, unspecified: Secondary | ICD-10-CM | POA: Diagnosis not present

## 2012-10-06 DIAGNOSIS — J189 Pneumonia, unspecified organism: Secondary | ICD-10-CM | POA: Diagnosis not present

## 2013-02-22 ENCOUNTER — Ambulatory Visit: Payer: Self-pay | Admitting: Oncology

## 2013-02-22 DIAGNOSIS — Z79899 Other long term (current) drug therapy: Secondary | ICD-10-CM | POA: Diagnosis not present

## 2013-02-22 DIAGNOSIS — G609 Hereditary and idiopathic neuropathy, unspecified: Secondary | ICD-10-CM | POA: Diagnosis not present

## 2013-02-22 DIAGNOSIS — F411 Generalized anxiety disorder: Secondary | ICD-10-CM | POA: Diagnosis not present

## 2013-02-22 DIAGNOSIS — E039 Hypothyroidism, unspecified: Secondary | ICD-10-CM | POA: Diagnosis not present

## 2013-02-22 DIAGNOSIS — Z85038 Personal history of other malignant neoplasm of large intestine: Secondary | ICD-10-CM | POA: Diagnosis not present

## 2013-02-22 DIAGNOSIS — F17200 Nicotine dependence, unspecified, uncomplicated: Secondary | ICD-10-CM | POA: Diagnosis not present

## 2013-02-22 DIAGNOSIS — Z9221 Personal history of antineoplastic chemotherapy: Secondary | ICD-10-CM | POA: Diagnosis not present

## 2013-02-22 DIAGNOSIS — I10 Essential (primary) hypertension: Secondary | ICD-10-CM | POA: Diagnosis not present

## 2013-02-22 DIAGNOSIS — K5730 Diverticulosis of large intestine without perforation or abscess without bleeding: Secondary | ICD-10-CM | POA: Diagnosis not present

## 2013-02-22 LAB — COMPREHENSIVE METABOLIC PANEL
Alkaline Phosphatase: 70 U/L (ref 50–136)
Anion Gap: 9 (ref 7–16)
BUN: 20 mg/dL — ABNORMAL HIGH (ref 7–18)
Bilirubin,Total: 0.4 mg/dL (ref 0.2–1.0)
Co2: 26 mmol/L (ref 21–32)
Creatinine: 1.32 mg/dL — ABNORMAL HIGH (ref 0.60–1.30)
EGFR (African American): 45 — ABNORMAL LOW
EGFR (Non-African Amer.): 39 — ABNORMAL LOW
Osmolality: 282 (ref 275–301)
Potassium: 4.5 mmol/L (ref 3.5–5.1)
SGPT (ALT): 18 U/L (ref 12–78)

## 2013-02-22 LAB — CBC CANCER CENTER
Comment - H1-Com1: NORMAL
Eosinophil %: 1.1 %
Eosinophil: 2 %
HGB: 12 g/dL (ref 12.0–16.0)
Lymphocyte #: 2 x10 3/mm (ref 1.0–3.6)
Lymphocytes: 25 %
MCH: 31.9 pg (ref 26.0–34.0)
Monocyte #: 0.9 x10 3/mm (ref 0.2–0.9)
Monocyte %: 9.8 %
Neutrophil #: 6.4 x10 3/mm (ref 1.4–6.5)
Platelet: 167 x10 3/mm (ref 150–440)
RBC: 3.77 10*6/uL — ABNORMAL LOW (ref 3.80–5.20)
RDW: 12.7 % (ref 11.5–14.5)
Segmented Neutrophils: 63 %
WBC: 9.6 x10 3/mm (ref 3.6–11.0)

## 2013-02-26 DIAGNOSIS — Z23 Encounter for immunization: Secondary | ICD-10-CM | POA: Diagnosis not present

## 2013-02-26 DIAGNOSIS — N390 Urinary tract infection, site not specified: Secondary | ICD-10-CM | POA: Diagnosis not present

## 2013-02-26 DIAGNOSIS — I10 Essential (primary) hypertension: Secondary | ICD-10-CM | POA: Diagnosis not present

## 2013-02-26 DIAGNOSIS — E039 Hypothyroidism, unspecified: Secondary | ICD-10-CM | POA: Diagnosis not present

## 2013-02-26 DIAGNOSIS — Z1331 Encounter for screening for depression: Secondary | ICD-10-CM | POA: Diagnosis not present

## 2013-02-26 DIAGNOSIS — Z0000 Encounter for general adult medical examination without abnormal findings: Secondary | ICD-10-CM | POA: Diagnosis not present

## 2013-02-27 ENCOUNTER — Ambulatory Visit: Payer: Self-pay | Admitting: Oncology

## 2013-05-28 DIAGNOSIS — R3129 Other microscopic hematuria: Secondary | ICD-10-CM | POA: Diagnosis not present

## 2013-05-28 DIAGNOSIS — R319 Hematuria, unspecified: Secondary | ICD-10-CM | POA: Diagnosis not present

## 2013-05-28 DIAGNOSIS — I129 Hypertensive chronic kidney disease with stage 1 through stage 4 chronic kidney disease, or unspecified chronic kidney disease: Secondary | ICD-10-CM | POA: Diagnosis not present

## 2013-05-28 DIAGNOSIS — I10 Essential (primary) hypertension: Secondary | ICD-10-CM | POA: Diagnosis not present

## 2013-06-01 IMAGING — CR DG CHEST 2V
1 series · 2 of 2 positions shown · non-contrast
Comparison: none

REASON FOR EXAM: Chest Pain
COMMENTS:

[Series 1: w chest pa · 0.14mm/px · 2 of 2 slices shown]
[im 1/2]
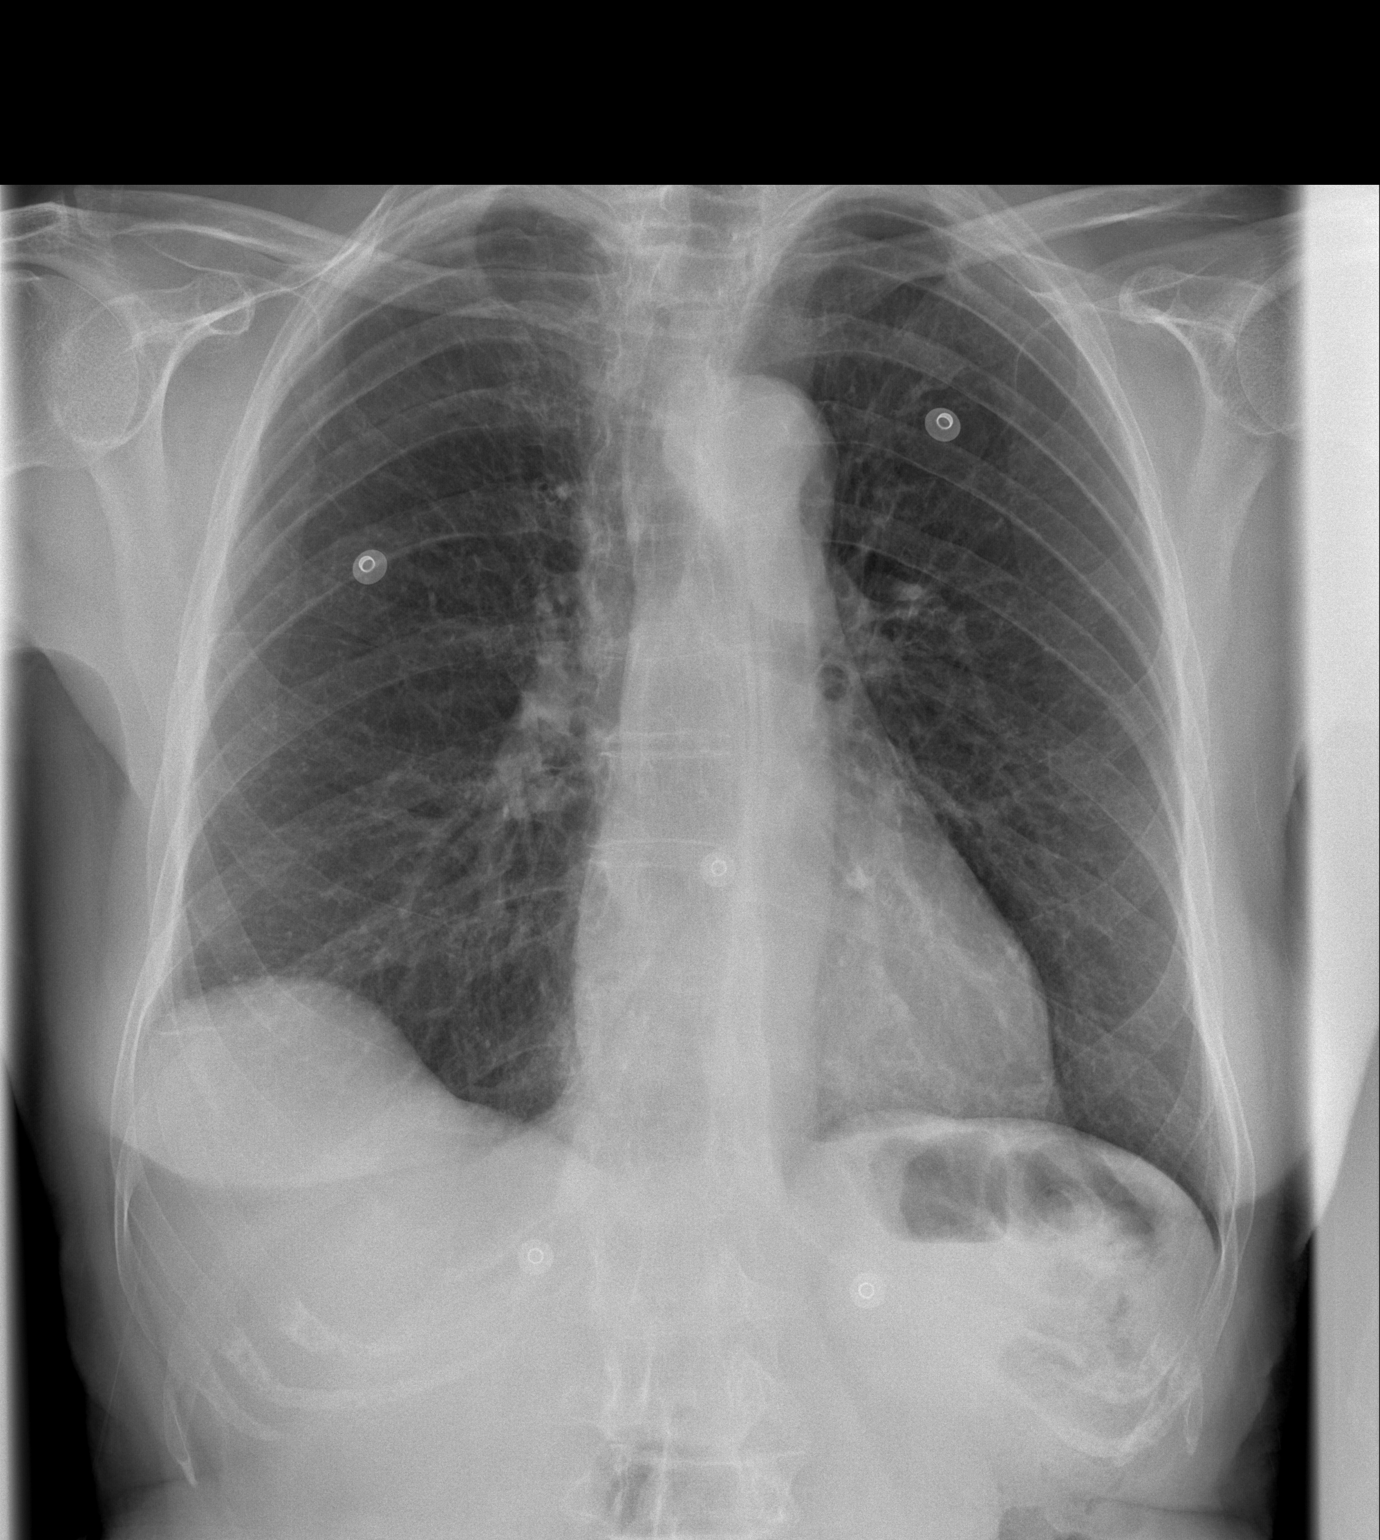
[im 2/2]
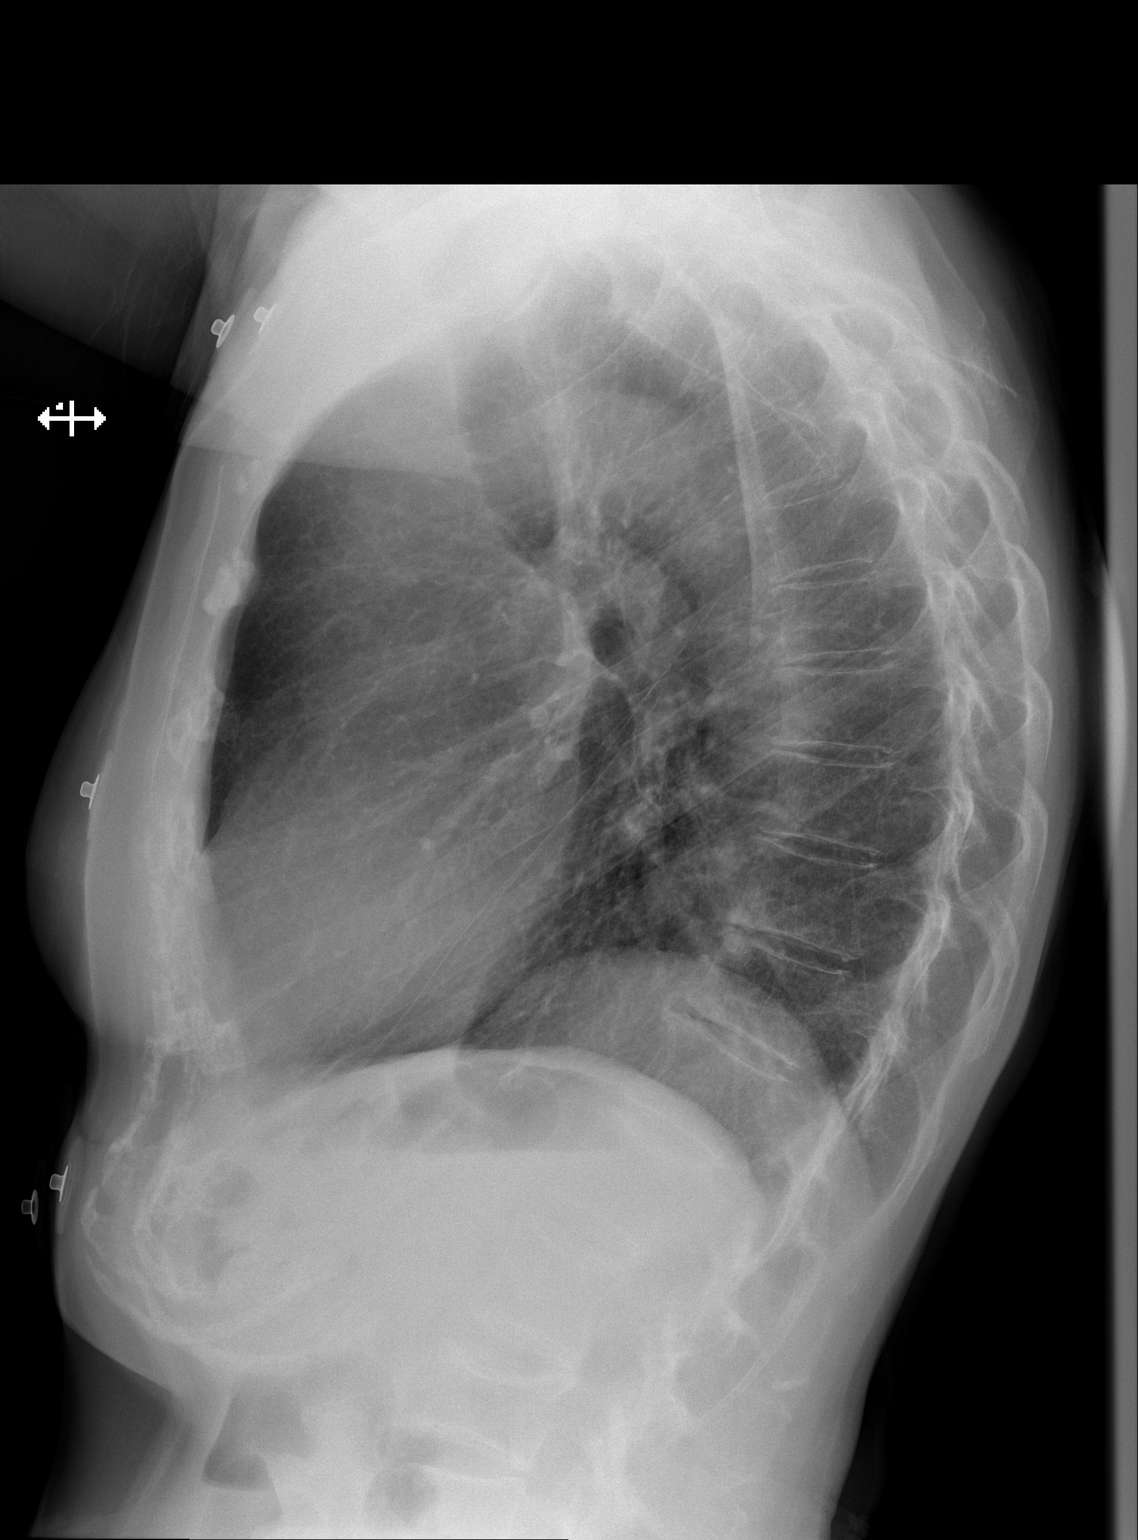

[2 of 2 positions shown; findings below may reference images not displayed]

PROCEDURE:     DXR - DXR CHEST PA (OR AP) AND LATERAL  - July 19, 2011 [DATE]

RESULT:     There is no previous exam for comparison. There is eventration
of the posterior lateral right hemidiaphragm. The lungs are clear but mildly
hyperinflated. The cardiac size is normal. There is no edema, infiltrate,
effusion or pneumothorax.
IMPRESSION: 1. No acute cardiopulmonary disease. Mild hyperinflation.

## 2013-07-14 DIAGNOSIS — Z1231 Encounter for screening mammogram for malignant neoplasm of breast: Secondary | ICD-10-CM | POA: Diagnosis not present

## 2013-07-14 LAB — HM MAMMOGRAPHY

## 2013-08-20 DIAGNOSIS — I10 Essential (primary) hypertension: Secondary | ICD-10-CM | POA: Diagnosis not present

## 2013-08-20 DIAGNOSIS — E7800 Pure hypercholesterolemia, unspecified: Secondary | ICD-10-CM | POA: Diagnosis not present

## 2013-08-20 DIAGNOSIS — G4700 Insomnia, unspecified: Secondary | ICD-10-CM | POA: Diagnosis not present

## 2013-08-20 DIAGNOSIS — E785 Hyperlipidemia, unspecified: Secondary | ICD-10-CM | POA: Diagnosis not present

## 2013-08-20 DIAGNOSIS — E039 Hypothyroidism, unspecified: Secondary | ICD-10-CM | POA: Diagnosis not present

## 2013-08-27 DIAGNOSIS — R319 Hematuria, unspecified: Secondary | ICD-10-CM | POA: Diagnosis not present

## 2013-08-27 DIAGNOSIS — N183 Chronic kidney disease, stage 3 unspecified: Secondary | ICD-10-CM | POA: Diagnosis not present

## 2013-08-27 DIAGNOSIS — I10 Essential (primary) hypertension: Secondary | ICD-10-CM | POA: Diagnosis not present

## 2013-08-27 DIAGNOSIS — N19 Unspecified kidney failure: Secondary | ICD-10-CM

## 2013-08-27 HISTORY — DX: Unspecified kidney failure: N19

## 2013-09-09 DIAGNOSIS — N183 Chronic kidney disease, stage 3 unspecified: Secondary | ICD-10-CM | POA: Diagnosis not present

## 2014-02-24 DIAGNOSIS — N183 Chronic kidney disease, stage 3 (moderate): Secondary | ICD-10-CM | POA: Diagnosis not present

## 2014-02-24 DIAGNOSIS — E039 Hypothyroidism, unspecified: Secondary | ICD-10-CM | POA: Diagnosis not present

## 2014-02-24 DIAGNOSIS — R312 Other microscopic hematuria: Secondary | ICD-10-CM | POA: Diagnosis not present

## 2014-02-28 ENCOUNTER — Ambulatory Visit: Payer: Self-pay | Admitting: Oncology

## 2014-02-28 DIAGNOSIS — Z79899 Other long term (current) drug therapy: Secondary | ICD-10-CM | POA: Diagnosis not present

## 2014-02-28 DIAGNOSIS — E039 Hypothyroidism, unspecified: Secondary | ICD-10-CM | POA: Diagnosis not present

## 2014-02-28 DIAGNOSIS — I10 Essential (primary) hypertension: Secondary | ICD-10-CM | POA: Diagnosis not present

## 2014-02-28 DIAGNOSIS — Z85038 Personal history of other malignant neoplasm of large intestine: Secondary | ICD-10-CM | POA: Diagnosis not present

## 2014-02-28 DIAGNOSIS — G629 Polyneuropathy, unspecified: Secondary | ICD-10-CM | POA: Diagnosis not present

## 2014-02-28 DIAGNOSIS — Z9221 Personal history of antineoplastic chemotherapy: Secondary | ICD-10-CM | POA: Diagnosis not present

## 2014-02-28 DIAGNOSIS — F419 Anxiety disorder, unspecified: Secondary | ICD-10-CM | POA: Diagnosis not present

## 2014-02-28 LAB — CBC CANCER CENTER
Basophil #: 0.1 "x10 3/mm "
Basophil %: 0.7 %
Eosinophil #: 0.1 "x10 3/mm "
Eosinophil %: 1.1 %
HCT: 34.9 % — ABNORMAL LOW
HGB: 11.5 g/dL — ABNORMAL LOW
Lymphocyte %: 19.8 %
Lymphs Abs: 1.8 "x10 3/mm "
MCH: 31.4 pg
MCHC: 32.9 g/dL
MCV: 96 fL
Monocyte #: 0.7 "x10 3/mm "
Monocyte %: 7.2 %
Neutrophil #: 6.6 "x10 3/mm " — ABNORMAL HIGH
Neutrophil %: 71.2 %
Platelet: 177 "x10 3/mm "
RBC: 3.65 "x10 6/mm " — ABNORMAL LOW
RDW: 12.7 %
WBC: 9.2 "x10 3/mm "

## 2014-02-28 LAB — COMPREHENSIVE METABOLIC PANEL
ALT: 18 U/L
AST: 16 U/L (ref 15–37)
Albumin: 3.8 g/dL (ref 3.4–5.0)
Alkaline Phosphatase: 72 U/L
Anion Gap: 5 — ABNORMAL LOW (ref 7–16)
BILIRUBIN TOTAL: 0.3 mg/dL (ref 0.2–1.0)
BUN: 21 mg/dL — AB (ref 7–18)
CHLORIDE: 106 mmol/L (ref 98–107)
CREATININE: 1.58 mg/dL — AB (ref 0.60–1.30)
Calcium, Total: 8.9 mg/dL (ref 8.5–10.1)
Co2: 28 mmol/L (ref 21–32)
EGFR (African American): 41 — ABNORMAL LOW
EGFR (Non-African Amer.): 34 — ABNORMAL LOW
Glucose: 109 mg/dL — ABNORMAL HIGH (ref 65–99)
OSMOLALITY: 281 (ref 275–301)
Potassium: 4.7 mmol/L (ref 3.5–5.1)
Sodium: 139 mmol/L (ref 136–145)
TOTAL PROTEIN: 7.2 g/dL (ref 6.4–8.2)

## 2014-03-01 LAB — CEA: CEA: 3.6 ng/mL (ref 0.0–4.7)

## 2014-03-04 DIAGNOSIS — M810 Age-related osteoporosis without current pathological fracture: Secondary | ICD-10-CM | POA: Diagnosis not present

## 2014-03-04 DIAGNOSIS — I129 Hypertensive chronic kidney disease with stage 1 through stage 4 chronic kidney disease, or unspecified chronic kidney disease: Secondary | ICD-10-CM | POA: Diagnosis not present

## 2014-03-04 DIAGNOSIS — E039 Hypothyroidism, unspecified: Secondary | ICD-10-CM | POA: Diagnosis not present

## 2014-03-04 DIAGNOSIS — Z23 Encounter for immunization: Secondary | ICD-10-CM | POA: Diagnosis not present

## 2014-03-14 DIAGNOSIS — H43812 Vitreous degeneration, left eye: Secondary | ICD-10-CM | POA: Diagnosis not present

## 2014-03-29 ENCOUNTER — Ambulatory Visit: Payer: Self-pay | Admitting: Oncology

## 2014-08-21 NOTE — Discharge Summary (Signed)
PATIENT NAME:  Sara Cannon, Sara Cannon MR#:  [id number] DATE OF BIRTH:  Jan 19, 1933  DATE OF ADMISSION:  07/19/2011 DATE OF DISCHARGE:  07/20/2011  PRIMARY CARE PHYSICIAN: Dr. Jeananne Rama   REASON FOR ADMISSION: Chest pressure and heaviness.   DISCHARGE DIAGNOSES:  1. Chest pressure and heaviness exact etiology unclear.   2. Acute renal failure.  3. Hypotension.  4. Leukocytosis, resolved.  5. Anemia.  6. History of colon cancer, status post surgery.  7. History of hypothyroidism.  8. History of hypertension.  9. Systolic dysfunction with ejection fraction 40% by cardiac stress test (treadmill Myoview) for which patient will follow up with cardiology as an outpatient.  CONSULTATIONS: None.   DISCHARGE DISPOSITION: Home.   DISCHARGE MEDICATIONS:  1. Evista 60 mg p.o. daily.  2. Levothyroxine 100 mcg daily.  3. Temazepam 15 mg 1 to 2 tablets p.o. at bedtime.  4. Aspirin 81 mg p.o. daily.   DISCHARGE CONDITION: Improved, stable.    DISCHARGE ACTIVITY: As tolerated.   DISCHARGE DIET: Low sodium, low fat, low cholesterol.   DISCHARGE INSTRUCTIONS:  1. Take medications as prescribed.  2. Return to Emergency Department for recurrence of symptoms or for development of any shortness of breath, dizziness, lightheadedness, feeling faint, chest pain, heart palpitations, weakness, numbness, tingling, headaches, fever, chills.   FOLLOW UP INSTRUCTIONS: 1. Follow up with Dr. Jeananne Rama within 1 to 2 weeks. Patient needs repeat BMP, CBC and blood pressure check within one week.  2. Follow up with Dr. Clayborn Bigness within 1 to 2 weeks. 3. Follow up with Dr. Oliva Bustard as previously advised.   LABORATORY, DIAGNOSTIC AND RADIOLOGICAL DATA: Chest x-ray PA and lateral 07/19/2011: No acute cardiopulmonary abnormalities are noted.   Treadmill Myoview 07/20/2011: Negative Myoview with possible mild cardiomyopathy of unclear etiology with depressed ejection fraction which is calculated about 40%. Will treat the  patient medically for now unless symptoms persist, worsen or recur per Dr. Clayborn Bigness and no cardiac catheterization recommended at this time unless symptoms recur.   EKG on admission: Normal sinus rhythm, heart rate 86 beats per minute with normal axis, normal intervals, no acute ST or T wave changes.   D-dimer 0.27.   Troponins were negative x3 sets. Normal CK and CK-MB level on the first set.   TSH 2.48 on remission.   CBC on admission: WBC 17.4, hemoglobin 12.5, hematocrit 36.4, platelets 156. CBC on the day of discharge: WBC 10.1, hemoglobin 10.5, hematocrit 38.6, platelets 151, MCV 94.   LFTs normal on admission.   BUN 21, creatinine 1.24 on admission. BUN 24, creatinine 1.32 on the day of discharge.   Lipid panel: Total cholesterol 147, triglycerides 45, HDL 52, LDL 86.   BRIEF HISTORY/HOSPITAL COURSE: Patient is a 81 year old female with past medical history of hypertension, hypothyroidism, colon cancer status post resection who presented to the Emergency Department with complaints of chest pressure and heaviness. Please see dictated admission history and physical for pertinent details surrounding the onset of this hospitalization. Please see below for further details.  1. Chest pressure and heaviness-Initially patient underwent EKG in the ER which was unremarkable. There were no acute ST or T wave changes and she also had a set of cardiac enzymes checked which were negative. Thereafter she was admitted to the observation unit. She was given a dose of aspirin and also a dose of morphine in the ER but she still had some chest pressure and heaviness thereafter. She felt as if her chest pressure was related to anxiety. She was given  a dose of temazepam and had led to eventual resolution of her chest pressure and heaviness and thereafter her symptoms did not recur. She has ruled out for myocardial infarction based off three negative sets of cardiac enzymes. PE was felt to be less likely given  normal d-dimer. Once she had ruled out for myocardial infarction she was sent for a treadmill Myoview which was negative for any ischemia but she was noted to have possible cardiomyopathy with some systolic dysfunction, ejection fraction 40% for which Dr. Clayborn Bigness does not recommend any further cardiac diagnostics including cardiac catheterization or echocardiogram at this time unless patient's symptoms recur. Patient will follow up with Dr. Clayborn Bigness closely as an outpatient. Her risk factors for coronary artery disease include hypertension. Her blood pressure has been running low during this hospitalization and her lisinopril has therefore been discontinued for now. She was recommended daily aspirin for cardioprotective measures since she has a risk factor of history of hypertension. She will follow up closely with cardiologist, Dr. Clayborn Bigness, as an outpatient. She is without any chest pressure or heaviness on the day of discharge at rest and with ambulation. She was advised to return to the ER immediately if her chest pressure or heaviness recur or if she develops any chest pain or shortness of breath and patient verbalized her understanding of these instructions and agreed to return to the ER if she noted any of the symptoms above. Her chest pressure and heaviness could have been secondary to anxiety as well but she stated to me that she does not need any medications for anxiety and will take temazepam at home as before and she already has a prescription for this. Exact etiology of systolic dysfunction, ejection fraction 40% is unclear at this time and she will follow up closely with Dr. Clayborn Bigness of cardiology as an outpatient for further recommendations and management and work-up.  2. Acute renal failure-Exact etiology unclear. This could be prerenal from volume depletion versus ATN from hypotension. Her daughter relayed to me that patient has not been consuming adequate amounts of fluid and liquid at home  including water. I have advised and encouraged patient to maintain adequate oral hydration and patient refused to stay or remain in the hospital any longer after her Myoview was performed and she was refusing any IV fluids and did not want to stay in the hospital to have her labs rechecked to see if her renal function had improved. She was explained the risks of worsening renal failure which could lead to worsening of her overall clinical condition including death and she verbalized understanding of this and stated that she wished to go home regardless and wished to have this followed and monitored by her primary care physician as an outpatient. Her ACE inhibitor has been discontinued given acute renal failure and also hypotension. In regards to hypotension, this is asymptomatic and she is without any dizziness, lightheadedness, or weakness but have discontinued her ACE inhibitor as we do not want her hypotension to worsen she was also noted to have acute renal failure as above. Recommend repeat blood pressure check per patient's primary care physician within one week and she was advised to return to the ER if she develops any dizziness, lightheadedness, weakness, or feeling faint and she verbalized her understanding of these instructions and agreed to return to the hospital if she develops any of these symptoms.  3. Leukocytosis-Exact etiology unclear. This could be stress induced as her WBC count has normalized and she has been  afebrile.  4. Anemia of unclear etiology-This is normocytic anemia and patient stated that she wished to have this further worked up as an outpatient by her primary care physician rather than in the inpatient setting and I advised the patient return to the ER immediately if she notices any bleeding whatsoever or any dark stools, dizziness, lightheadedness, weakness, shortness of breath, feeling faint or for racing heart or heart palpitations and she verbalized her understanding of these  instructions and agreed to return to the ER if she developed any of these symptoms.  5. History of colon cancer, status post surgery-Patient to continue to follow up with her oncologist, Dr. Oliva Bustard, as previously advised. 6. Hypothyroidism-Patient to continue Synthroid. Her TSH was within normal limits.  7. On 07/20/2011 patient was without any chest pressure or heaviness and other than mild asymptomatic hypotension she was otherwise hemodynamically stable and felt to be stable for discharge home with close outpatient follow up to which patient was agreeable.   TIME SPENT ON DISCHARGE: Greater than 30 minutes.  ____________________________ Romie Jumper, MD knl:cms D: 07/24/2011 20:48:22 ET T: 07/25/2011 12:14:43 ET JOB#: [id number] cc: Romie Jumper, MD, <Dictator> Guadalupe Maple, MD Dwayne D. Clayborn Bigness, MD Romie Jumper MD ELECTRONICALLY SIGNED 08/07/2011 22:01

## 2014-08-21 NOTE — H&P (Signed)
PATIENT NAME:  Sara Cannon, Sara Cannon MR#:  [id number] DATE OF BIRTH:  [birth year]  DATE OF ADMISSION:  07/19/2011  PRIMARY CARE PHYSICIAN: Golden Pop, MD  REFERRING PHYSICIAN: Graciella Freer, MD  CHIEF COMPLAINT: Chest discomfort since 3 o'clock  a.m. this morning.  HISTORY OF PRESENT ILLNESS: This is a 81 year old Caucasian female with a history of hypertension, hypothyroidism, and colon cancer status post partial colectomy in 2002 who came to the ED for evaluation of chest discomfort. The patient is alert, awake, and oriented. She stated that she felt chest discomfort after she woke at about 3:30 a.m. today. The chest discomfort is pressure like, but no real pain, no radiation, no palpitations, orthopnea, or nocturnal dyspnea. She denies any other symptoms. She said she lisinopril 10 mg p.o. at about 9 o'clock  a.m. this today. She was given aspirin 325 mg in the ED and first set of troponin level is negative. D-dimer is negative. The patient was admitted by Dr. Jasmine December to rule out ACS.   PAST MEDICAL HISTORY:  1. Hypertension. 2. Hypothyroidism. 3. Colon cancer.   PAST SURGICAL HISTORY: Partial colectomy.   SOCIAL HISTORY: She smokes once in a while. She denies any alcohol drinking or illicit drugs.   FAMILY HISTORY: Mother had hypertension.   ALLERGIES: No known drug allergies.   MEDICATIONS:  1. Lisinopril 10 mg p.o. daily. 2. Synthroid 0.1 mg p.o. daily.  3. Temazepam 15 mg p.o. at bedtime for sleep.  REVIEW OF SYSTEMS: CONSTITUTIONAL: The patient denies any fever, chills. No headache or dizziness. No weight loss or weight gain.  EYES: No blurred vision, double vision and no cataract. HEENT: No ear pain, epistaxis, discharge, or postnasal drip. No dysphagia. RESPIRATORY: No cough, sputum, shortness of breath, or hemoptysis. CARDIOVASCULAR: Positive for chest discomfort, but no diaphoresis, palpitations, orthopnea, or nocturnal dyspnea. No leg edema. GASTROINTESTINAL: No abdominal  pain, nausea, vomiting, or diarrhea. GU: No dysuria or hematuria. SKIN: No rash or jaundice. HEMATOLOGY: No easy bruising or bleeding. PSYCHIATRIC: No depression and no anxiety. NEUROLOGY: No syncope, loss of consciousness or seizure.   PHYSICAL EXAMINATION:   VITALS: Temperature 98.1, blood pressure 110/55, pulse 90, respirations 14, oxygen saturation 99% on room air.   GENERAL: The patient is alert, awake, and oriented, in no acute distress.   HEENT: Pupils round, equal, and reactive to light and accommodation. Moist oral mucosa. Clear oropharynx.   NECK: Supple. No JVD or carotid bruits. No lymphadenopathy. No thyromegaly.   LUNGS: Bilateral air entry. No wheezing or rales. No use of muscles to breathe.  ABDOMEN: Soft. No distention or tenderness. No organomegaly. Bowel sounds present.   EXTREMITIES: No edema, clubbing, or cyanosis. No calf tenderness.   SKIN: No rash or jaundice.   NEUROLOGY: Alert and oriented x3. No focal deficits. Deep tendon reflexes 2+. Power 5/5.  Sensation intact.   LABS/STUDIES: D-dimer 0.27.   Chest x-ray: No acute cardiopulmonary disease.   Troponin less than 0.02. CK 55. WBC 17.4, hemoglobin 12.5, and platelets 156. Glucose 128, BUN 21, and creatinine 1.24. Electrolytes are normal.  EKG: Normal sinus rhythm at 87 beats per minute.   IMPRESSION:  1. Chest discomfort.  2. Hypertension, controlled.  3. Leukocytosis. 4. Dehydration.  5. History of colon cancer.  6. Hypothyroidism.   PLAN OF TREATMENT: The patient will be placed for observation. We will continue telemetry monitor, O2 by nasal cannula, and we will check troponin level x2 and check lipid panel and TSH. We will repeat CBC and BMP.  Encourage oral fluid intake. We will get a stress test and continue lisinopril for hypertension. GI and deep vein thrombosis prophylaxis. I discussed the patient's situation and plan of treatment with the patient. She voiced understanding and agreed to the  current treatment plan.   TIME SPENT: Approximately 55 minutes.  ____________________________ Demetrios Loll, MD qc:slb D: 07/19/2011 15:00:15 ET T: 07/19/2011 15:18:13 ET JOB#: [id number]  cc: Demetrios Loll, MD, <Dictator> Guadalupe Maple, MD Demetrios Loll MD ELECTRONICALLY SIGNED 07/19/2011 15:59

## 2014-09-01 DIAGNOSIS — N183 Chronic kidney disease, stage 3 (moderate): Secondary | ICD-10-CM | POA: Diagnosis not present

## 2014-09-01 DIAGNOSIS — I10 Essential (primary) hypertension: Secondary | ICD-10-CM | POA: Diagnosis not present

## 2014-09-01 DIAGNOSIS — R312 Other microscopic hematuria: Secondary | ICD-10-CM | POA: Diagnosis not present

## 2014-09-01 DIAGNOSIS — N261 Atrophy of kidney (terminal): Secondary | ICD-10-CM | POA: Diagnosis not present

## 2014-09-01 DIAGNOSIS — I129 Hypertensive chronic kidney disease with stage 1 through stage 4 chronic kidney disease, or unspecified chronic kidney disease: Secondary | ICD-10-CM | POA: Diagnosis not present

## 2014-09-30 ENCOUNTER — Telehealth: Payer: Self-pay | Admitting: Unknown Physician Specialty

## 2014-09-30 NOTE — Telephone Encounter (Signed)
Pt said she is done doctoring on herself for a sinus infection. She said she took claritin bc the pharmacy said it wouldn't raise her bp and it didn't work and she would like tussinex.

## 2014-09-30 NOTE — Telephone Encounter (Signed)
I do not prescribe Tussionex without an office visit. She can get OTC codeine cough medication from [place] if she needs something stronger than OTC meds

## 2014-10-24 ENCOUNTER — Encounter: Payer: Self-pay | Admitting: General Surgery

## 2014-10-25 ENCOUNTER — Ambulatory Visit (INDEPENDENT_AMBULATORY_CARE_PROVIDER_SITE_OTHER): Payer: Medicare Other | Admitting: General Surgery

## 2014-10-25 ENCOUNTER — Encounter: Payer: Self-pay | Admitting: General Surgery

## 2014-10-25 DIAGNOSIS — Z85038 Personal history of other malignant neoplasm of large intestine: Secondary | ICD-10-CM

## 2014-10-25 MED ORDER — POLYETHYLENE GLYCOL 3350 17 GM/SCOOP PO POWD: 1.0000 | Freq: Once | ORAL | Status: DC

## 2014-10-25 NOTE — Patient Instructions (Addendum)
Colonoscopy A colonoscopy is an exam to look at the entire large intestine (colon). This exam can help find problems such as tumors, polyps, inflammation, and areas of bleeding. The exam takes about 1 hour.  LET [facility] CARE PROVIDER KNOW ABOUT:   Any allergies you have.  All medicines you are taking, including vitamins, herbs, eye drops, creams, and over-the-counter medicines.  Previous problems you or members of your family have had with the use of anesthetics.  Any blood disorders you have.  Previous surgeries you have had.  Medical conditions you have. RISKS AND COMPLICATIONS  Generally, this is a safe procedure. However, as with any procedure, complications can occur. Possible complications include:  Bleeding.  Tearing or rupture of the colon wall.  Reaction to medicines given during the exam.  Infection (rare). BEFORE THE PROCEDURE   Ask your health care provider about changing or stopping your regular medicines.  You may be prescribed an oral bowel prep. This involves drinking a large amount of medicated liquid, starting the day before your procedure. The liquid will cause you to have multiple loose stools until your stool is almost clear or light green. This cleans out your colon in preparation for the procedure.  Do not eat or drink anything else once you have started the bowel prep, unless your health care provider tells you it is safe to do so.  Arrange for someone to drive you home after the procedure. PROCEDURE   You will be given medicine to help you relax (sedative).  You will lie on your side with your knees bent.  A long, flexible tube with a light and camera on the end (colonoscope) will be inserted through the rectum and into the colon. The camera sends video back to a computer screen as it moves through the colon. The colonoscope also releases carbon dioxide gas to inflate the colon. This helps your health care provider see the area better.  During  the exam, your health care provider may take a small tissue sample (biopsy) to be examined under a microscope if any abnormalities are found.  The exam is finished when the entire colon has been viewed. AFTER THE PROCEDURE   Do not drive for 24 hours after the exam.  You may have a small amount of blood in your stool.  You may pass moderate amounts of gas and have mild abdominal cramping or bloating. This is caused by the gas used to inflate your colon during the exam.  Ask when your test results will be ready and how you will get your results. Make sure you get your test results. Document Released: 04/12/2000 Document Revised: 02/03/2013 Document Reviewed: 12/21/2012 [facility] Patient Information 2015 [place], [place]. This information is not intended to replace advice given to you by your health care provider. Make sure you discuss any questions you have with your health care provider.  Patient is scheduled for a colonoscopy at [facility] on 12/14/14. She is aware to pre register with the hospital at least two days prior. She will only take her Lisinopril at 6 am with a small sip of water the morning of. Miralax prescription has been sent into her pharmacy. Patient is aware of date and instructions.

## 2014-10-25 NOTE — Progress Notes (Signed)
Patient ID: Sara Cannon, female   DOB: Apr 28, 1933, 81 y.o.   MRN: [id number]  Chief Complaint  Patient presents with  . Colonoscopy    HPI Sara Cannon is a 81 y.o. female here today for a evalaution of a colonoscopy. Patient last colonoscopy was done in 2011. She states she is not having any GI problems at this time.  In the last year the patient's renal function was noted to be down, but she remains asymptomatic. HPI  Past Medical History  Diagnosis Date  . Cancer 2007    colon .Had chemo  . Renal failure May 2015    Dr Candiss Norse    Past Surgical History  Procedure Laterality Date  . Colonoscopy  2008, 2011    Dr Bary Castilla  . Colon resection  2007  . Portacath placement  2007  . Port-a-cath removal  2009    Family History  Problem Relation Age of Onset  . Heart disease Mother     Social History History  Substance Use Topics  . Smoking status: Never Smoker   . Smokeless tobacco: Never Used  . Alcohol Use: No    No Known Allergies  Current Outpatient Prescriptions  Medication Sig Dispense Refill  . aspirin 81 MG tablet Take 81 mg by mouth daily.    Marland Kitchen lisinopril (PRINIVIL,ZESTRIL) 20 MG tablet TK 1 T PO  QD  3  . raloxifene (EVISTA) 60 MG tablet TK 1 T PO  QD  3  . SYNTHROID 100 MCG tablet Take 100 mcg by mouth daily before breakfast.     . temazepam (RESTORIL) 15 MG capsule TK 1-2 C PO QD HS  1  . polyethylene glycol powder (GLYCOLAX/MIRALAX) powder Take 255 g by mouth once. 255 g 0   No current facility-administered medications for this visit.    Review of Systems Review of Systems  Constitutional: Negative.   Respiratory: Negative.   Cardiovascular: Negative.   Gastrointestinal: Negative for vomiting, diarrhea, constipation and blood in stool.    Blood pressure 138/70, pulse 78, resp. rate 14, height 5\' 7"  (1.702 m), weight 140 lb (63.504 kg).  Physical Exam Physical Exam  Constitutional: She is oriented to person, place, and time. She appears  well-developed and well-nourished.  HENT:  Mouth/Throat: Oropharynx is clear and moist.  Eyes: Conjunctivae are normal. No scleral icterus.  Neck: Neck supple.  Cardiovascular: Normal rate, regular rhythm and normal heart sounds.   Pulmonary/Chest: Effort normal and breath sounds normal.  Abdominal: Soft. Normal appearance and bowel sounds are normal. There is no hepatomegaly. There is no tenderness. No hernia.  Lymphadenopathy:    She has no cervical adenopathy.  Neurological: She is alert and oriented to person, place, and time.  Skin: Skin is warm and dry.  Psychiatric: She has a normal mood and affect.    Data Reviewed Previous colonoscopies.  November 2015 laboratory studies. Creatinine 1.5.  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  Excellent functional status.    Plan        Colonoscopy with possible biopsy/polypectomy prn: Information regarding the procedure, including its potential risks and complications (including but not limited to perforation of the bowel, which may require emergency surgery to repair, and bleeding) was verbally given to the patient. Educational information regarding lower instestinal endoscopy was given to the patient. Written instructions for how to complete the bowel prep using Miralax were provided. The importance of drinking ample fluids  to avoid dehydration as a result of the prep emphasized.  Patient is scheduled for a colonoscopy at [facility] on 12/14/14. She is aware to pre register with the hospital at least two days prior. She will only take her Lisinopril at 6 am with a small sip of water the morning of. Miralax prescription has been sent into her pharmacy. Patient is aware of date and instructions.   PCP:  Kennieth Francois 10/26/2014, 7:22 PM

## 2014-10-26 ENCOUNTER — Encounter: Payer: Self-pay | Admitting: General Surgery

## 2014-10-26 DIAGNOSIS — Z85038 Personal history of other malignant neoplasm of large intestine: Secondary | ICD-10-CM | POA: Insufficient documentation

## 2014-10-26 NOTE — H&P (Signed)
HPI  Sara Cannon is a 81 y.o. female here today for a evalaution of a colonoscopy. Patient last colonoscopy was done in 2011. She states she is not having any GI problems at this time.  In the last year the patient's renal function was noted to be down, but she remains asymptomatic.  HPI  Past Medical History   Diagnosis  Date   .  Cancer  2007     colon .Had chemo   .  Renal failure  May 2015     Dr Candiss Norse    Past Surgical History   Procedure  Laterality  Date   .  Colonoscopy   2008, 2011     Dr Bary Castilla   .  Colon resection   2007   .  Portacath placement   2007   .  Port-a-cath removal   2009    Family History   Problem  Relation  Age of Onset   .  Heart disease  Mother     Social History  History   Substance Use Topics   .  Smoking status:  Never Smoker   .  Smokeless tobacco:  Never Used   .  Alcohol Use:  No    No Known Allergies  Current Outpatient Prescriptions   Medication  Sig  Dispense  Refill   .  aspirin 81 MG tablet  Take 81 mg by mouth daily.     Marland Kitchen  lisinopril (PRINIVIL,ZESTRIL) 20 MG tablet  TK 1 T PO QD   3   .  raloxifene (EVISTA) 60 MG tablet  TK 1 T PO QD   3   .  SYNTHROID 100 MCG tablet  Take 100 mcg by mouth daily before breakfast.     .  temazepam (RESTORIL) 15 MG capsule  TK 1-2 C PO QD HS   1   .  polyethylene glycol powder (GLYCOLAX/MIRALAX) powder  Take 255 g by mouth once.  255 g  0    No current facility-administered medications for this visit.    Review of Systems  Review of Systems  Constitutional: Negative.  Respiratory: Negative.  Cardiovascular: Negative.  Gastrointestinal: Negative for vomiting, diarrhea, constipation and blood in stool.   Blood pressure 138/70, pulse 78, resp. rate 14, height 5\' 7"  (1.702 m), weight 140 lb (63.504 kg).  Physical Exam  Physical Exam  Constitutional: She is oriented to person, place, and time. She appears well-developed and well-nourished.  HENT:  Mouth/Throat: Oropharynx is clear and moist.   Eyes: Conjunctivae are normal. No scleral icterus.  Neck: Neck supple.  Cardiovascular: Normal rate, regular rhythm and normal heart sounds.  Pulmonary/Chest: Effort normal and breath sounds normal.  Abdominal: Soft. Normal appearance and bowel sounds are normal. There is no hepatomegaly. There is no tenderness. No hernia.  Lymphadenopathy:  She has no cervical adenopathy.  Neurological: She is alert and oriented to person, place, and time.  Skin: Skin is warm and dry.  Psychiatric: She has a normal mood and affect.   Data Reviewed  Previous colonoscopies.  November 2015 laboratory studies. Creatinine 1.5.  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  Excellent functional status.   Plan    Colonoscopy with possible biopsy/polypectomy prn: Information regarding the procedure, including its potential risks and complications (including but not limited to perforation of the bowel, which may require emergency surgery to repair, and bleeding) was verbally given to the patient. Educational  information regarding lower instestinal endoscopy was given to the patient. Written instructions for how to complete the bowel prep using Miralax were provided. The importance of drinking ample fluids to avoid dehydration as a result of the prep emphasized.  Patient is scheduled for a colonoscopy at [facility] on 12/14/14. She is aware to pre register with the hospital at least two days prior. She will only take her Lisinopril at 6 am with a small sip of water the morning of. Miralax prescription has been sent into her pharmacy. Patient is aware of date and instructions.  PCP: Kennieth Francois

## 2014-12-07 ENCOUNTER — Telehealth: Payer: Self-pay | Admitting: *Deleted

## 2014-12-07 NOTE — Telephone Encounter (Signed)
Patient was contacted today and confirms no medication changes since last office visit. Also, reports that she has Miralax prescription.  We will proceed with colonoscopy that is scheduled at [facility] for 12-14-14.  This patient was instructed to call the office should she have further questions.

## 2014-12-12 ENCOUNTER — Encounter: Payer: Self-pay | Admitting: *Deleted

## 2014-12-14 ENCOUNTER — Ambulatory Visit: Payer: Medicare Other | Admitting: Anesthesiology

## 2014-12-14 ENCOUNTER — Encounter: Payer: Self-pay | Admitting: *Deleted

## 2014-12-14 ENCOUNTER — Ambulatory Visit
Admission: RE | Admit: 2014-12-14 | Discharge: 2014-12-14 | Disposition: A | Discharge status: Home / Self Care | Payer: Medicare Other | Source: Ambulatory Visit | Attending: General Surgery | Admitting: General Surgery

## 2014-12-14 ENCOUNTER — Encounter
Admission: RE | Disposition: A | Discharge status: Home / Self Care | Payer: Self-pay | Source: Ambulatory Visit | Attending: General Surgery

## 2014-12-14 DIAGNOSIS — Z9221 Personal history of antineoplastic chemotherapy: Secondary | ICD-10-CM | POA: Diagnosis not present

## 2014-12-14 DIAGNOSIS — Z7982 Long term (current) use of aspirin: Secondary | ICD-10-CM | POA: Insufficient documentation

## 2014-12-14 DIAGNOSIS — Z08 Encounter for follow-up examination after completed treatment for malignant neoplasm: Secondary | ICD-10-CM | POA: Diagnosis not present

## 2014-12-14 DIAGNOSIS — Z79899 Other long term (current) drug therapy: Secondary | ICD-10-CM | POA: Insufficient documentation

## 2014-12-14 DIAGNOSIS — Z86010 Personal history of colonic polyps: Secondary | ICD-10-CM | POA: Insufficient documentation

## 2014-12-14 DIAGNOSIS — E039 Hypothyroidism, unspecified: Secondary | ICD-10-CM | POA: Diagnosis not present

## 2014-12-14 DIAGNOSIS — N261 Atrophy of kidney (terminal): Secondary | ICD-10-CM | POA: Diagnosis not present

## 2014-12-14 DIAGNOSIS — Z85038 Personal history of other malignant neoplasm of large intestine: Secondary | ICD-10-CM | POA: Insufficient documentation

## 2014-12-14 HISTORY — PX: COLONOSCOPY WITH PROPOFOL: SHX5780

## 2014-12-14 LAB — HM COLONOSCOPY

## 2014-12-14 SURGERY — COLONOSCOPY WITH PROPOFOL
Anesthesia: General

## 2014-12-14 MED ORDER — PROPOFOL 10 MG/ML IV BOLUS
INTRAVENOUS | Status: DC | PRN
  Administered 2014-12-14: 50 mg via INTRAVENOUS

## 2014-12-14 MED ORDER — PROPOFOL INFUSION 10 MG/ML OPTIME
INTRAVENOUS | Status: DC | PRN
  Administered 2014-12-14: 180 ug/kg/min via INTRAVENOUS

## 2014-12-14 MED ORDER — PHENYLEPHRINE HCL 10 MG/ML IJ SOLN
INTRAMUSCULAR | Status: DC | PRN
  Administered 2014-12-14 (×2): 50 ug via INTRAVENOUS

## 2014-12-14 MED ORDER — LIDOCAINE HCL (CARDIAC) 20 MG/ML IV SOLN
INTRAVENOUS | Status: DC | PRN
  Administered 2014-12-14: 60 mg via INTRAVENOUS

## 2014-12-14 MED ORDER — SODIUM CHLORIDE 0.9 % IV SOLN
INTRAVENOUS | Status: DC
  Administered 2014-12-14: 1000 mL via INTRAVENOUS

## 2014-12-14 NOTE — Anesthesia Preprocedure Evaluation (Signed)
Anesthesia Evaluation  Patient identified by MRN, date of birth, ID band Patient awake    Reviewed: Allergy & Precautions, NPO status , Patient's Chart, lab work & pertinent test results  Airway Mallampati: II       Dental no notable dental hx. (+) Caps   Pulmonary neg pulmonary ROS,  breath sounds clear to auscultation  Pulmonary exam normal       Cardiovascular negative cardio ROS Normal cardiovascular exam    Neuro/Psych negative neurological ROS  negative psych ROS   GI/Hepatic Hx of CA of descending colon   Endo/Other  Hypothyroidism   Renal/GU Renal diseaseHx of renal failure in 2015 secondary to atrophic kidney  negative genitourinary   Musculoskeletal negative musculoskeletal ROS (+)   Abdominal Normal abdominal exam  (+)   Peds negative pediatric ROS (+)  Hematology negative hematology ROS (+)   Anesthesia Other Findings   Reproductive/Obstetrics                             Anesthesia Physical Anesthesia Plan  ASA: III  Anesthesia Plan: General   Post-op Pain Management:    Induction: Intravenous  Airway Management Planned:   Additional Equipment:   Intra-op Plan:   Post-operative Plan:   Informed Consent: I have reviewed the patients History and Physical, chart, labs and discussed the procedure including the risks, benefits and alternatives for the proposed anesthesia with the patient or authorized representative who has indicated his/her understanding and acceptance.   Dental advisory given  Plan Discussed with: CRNA and Surgeon  Anesthesia Plan Comments:         Anesthesia Quick Evaluation

## 2014-12-14 NOTE — Transfer of Care (Signed)
Immediate Anesthesia Transfer of Care Note  Patient: [name]  Procedure(s) Performed: Procedure(s): COLONOSCOPY WITH PROPOFOL (N/A)  Patient Location: PACU  Anesthesia Type:General  Level of Consciousness: awake, alert  and oriented  Airway & Oxygen Therapy: Patient Spontanous Breathing and Patient connected to nasal cannula oxygen  Post-op Assessment: Report given to RN and Post -op Vital signs reviewed and stable  Post vital signs: stable  Last Vitals:  Filed Vitals:   12/14/14 1144  BP: 91/58  Pulse: 69  Temp: 36.3 C  Resp: 20    Complications: No apparent anesthesia complications

## 2014-12-14 NOTE — H&P (Signed)
Sara Cannon is an 81 y.o. female.   Chief Complaint: Follow-up colonoscopy.   HPI: 9 years status post sigmoid resection for obstructing carcinoma. Follow-up colonoscopy plan. Dysplastic rectal polyps in 2008.  Past Medical History  Diagnosis Date  . Renal failure May 2015    Dr Candiss Norse  . 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  . Cancer 2007    Descending colon, T3, N0. Adjuvant chemotherapy.    Past Surgical History  Procedure Laterality Date  . Colonoscopy  2008, 2011    Dr Bary Castilla, hyperplastic rectal polyps identified May 2008.  . Colon resection  2007  . Portacath placement  2007  . Port-a-cath removal  2009  . Eye surgery      Family History  Problem Relation Age of Onset  . Heart disease Mother    Social History:  reports that she has never smoked. She has never used smokeless tobacco. She reports that she does not drink alcohol or use illicit drugs.  Allergies: No Known Allergies  Medications Prior to Admission  Medication Sig Dispense Refill  . aspirin 81 MG tablet Take 81 mg by mouth daily.    Marland Kitchen lisinopril (PRINIVIL,ZESTRIL) 20 MG tablet TK 1 T PO  QD  3  . polyethylene glycol powder (GLYCOLAX/MIRALAX) powder Take 255 g by mouth once. 255 g 0  . raloxifene (EVISTA) 60 MG tablet TK 1 T PO  QD  3  . SYNTHROID 100 MCG tablet Take 100 mcg by mouth daily before breakfast.     . temazepam (RESTORIL) 15 MG capsule TK 1-2 C PO QD HS  1    No results found for this or any previous visit (from the past 48 hour(s)). No results found.  Review of Systems  Constitutional: Negative.   Respiratory: Negative.   Cardiovascular: Negative.   Gastrointestinal: Positive for vomiting (with prep).    Blood pressure 138/58, pulse 69, temperature 97.9 F (36.6 C), temperature source Oral, resp. rate 18, height 5' 6.5" (1.689 m), weight 140 lb (63.504 kg), SpO2 100 %. Physical Exam  Constitutional: She appears well-developed and well-nourished.  Neck: Neck supple.   Cardiovascular: Normal rate and regular rhythm.   Respiratory: Effort normal and breath sounds normal.  GI: Soft.     Assessment/Plan Follow-up colonoscopy.  Robert Bellow 12/14/2014, 11:10 AM

## 2014-12-14 NOTE — Op Note (Signed)
[facility] Patient Name: Sara Cannon Procedure Date: 12/14/2014 11:03 AM MRN: [id number] Account #: [id number] Date of Birth: 25-Feb-1933 Admit Type: Outpatient Age: 81 Room: [facility] ENDO ROOM 1 Gender: Female Note Status: Finalized Procedure:         Colonoscopy Indications:       High risk colon cancer surveillance: Personal history of                     colon cancer Providers:         Robert Bellow, MD Referring MD:      Guadalupe Maple, MD (Referring MD) Medicines:         Monitored Anesthesia Care Complications:     No immediate complications. Procedure:         Pre-Anesthesia Assessment:                    - Prior to the procedure, a History and Physical was                     performed, and patient medications, allergies and                     sensitivities were reviewed. The patient's tolerance of                     previous anesthesia was reviewed.                    - The risks and benefits of the procedure and the sedation                     options and risks were discussed with the patient. All                     questions were answered and informed consent was obtained.                    After obtaining informed consent, the colonoscope was                     passed under direct vision. Throughout the procedure, the                     patient's blood pressure, pulse, and oxygen saturations                     were monitored continuously. The Colonoscope was                     introduced through the anus and advanced to the the cecum,                     identified by the appendiceal orifice, IC valve and                     transillumination. The colonoscopy was performed without                     difficulty. The patient tolerated the procedure well. The                     quality of the bowel preparation was excellent. Findings:      The entire examined colon appeared normal on direct and retroflexion  views. Impression:        - The entire examined colon is normal on direct and                     retroflexion views.                    - No specimens collected. Recommendation:    - Discharge patient to home (via wheelchair). Diagnosis Code(s): --- Professional ---                    R51.884, Personal history of other malignant neoplasm of                     large intestine Robert Bellow, MD 12/14/2014 11:41:10 AM This report has been signed electronically. Number of Addenda: 0 Note Initiated On: 12/14/2014 11:03 AM Scope Withdrawal Time: 0 hours 6 minutes 44 seconds  Total Procedure Duration: 0 hours 22 minutes 1 second       [facility]

## 2014-12-14 NOTE — Anesthesia Postprocedure Evaluation (Signed)
  Anesthesia Post-op Note  Patient: Sara Cannon  Procedure(s) Performed: Procedure(s): COLONOSCOPY WITH PROPOFOL (N/A)  Anesthesia type:General  Patient location: PACU  Post pain: Pain level controlled  Post assessment: Post-op Vital signs reviewed, Patient's Cardiovascular Status Stable, Respiratory Function Stable, Patent Airway and No signs of Nausea or vomiting  Post vital signs: Reviewed and stable  Last Vitals:  Filed Vitals:   12/14/14 1210  BP: 120/55  Pulse: 60  Temp:   Resp: 20    Level of consciousness: awake, alert  and patient cooperative  Complications: No apparent anesthesia complications

## 2014-12-16 ENCOUNTER — Encounter: Payer: Self-pay | Admitting: General Surgery

## 2015-03-03 ENCOUNTER — Other Ambulatory Visit: Payer: Self-pay | Admitting: *Deleted

## 2015-03-03 DIAGNOSIS — Z85038 Personal history of other malignant neoplasm of large intestine: Secondary | ICD-10-CM

## 2015-03-06 ENCOUNTER — Other Ambulatory Visit: Payer: Self-pay | Admitting: Unknown Physician Specialty

## 2015-03-06 ENCOUNTER — Inpatient Hospital Stay: Payer: Medicare Other | Attending: Oncology

## 2015-03-06 ENCOUNTER — Encounter: Payer: Self-pay | Admitting: Oncology

## 2015-03-06 ENCOUNTER — Inpatient Hospital Stay (HOSPITAL_BASED_OUTPATIENT_CLINIC_OR_DEPARTMENT_OTHER): Payer: Medicare Other | Admitting: Oncology

## 2015-03-06 VITALS — BP 132/71 | HR 71 | Temp 96.6°F | Wt 140.0 lb

## 2015-03-06 DIAGNOSIS — F418 Other specified anxiety disorders: Secondary | ICD-10-CM

## 2015-03-06 DIAGNOSIS — G620 Drug-induced polyneuropathy: Secondary | ICD-10-CM

## 2015-03-06 DIAGNOSIS — N189 Chronic kidney disease, unspecified: Secondary | ICD-10-CM

## 2015-03-06 DIAGNOSIS — T451X5S Adverse effect of antineoplastic and immunosuppressive drugs, sequela: Secondary | ICD-10-CM | POA: Diagnosis not present

## 2015-03-06 DIAGNOSIS — Z7982 Long term (current) use of aspirin: Secondary | ICD-10-CM

## 2015-03-06 DIAGNOSIS — Z9221 Personal history of antineoplastic chemotherapy: Secondary | ICD-10-CM | POA: Diagnosis not present

## 2015-03-06 DIAGNOSIS — I129 Hypertensive chronic kidney disease with stage 1 through stage 4 chronic kidney disease, or unspecified chronic kidney disease: Secondary | ICD-10-CM | POA: Diagnosis not present

## 2015-03-06 DIAGNOSIS — Z79899 Other long term (current) drug therapy: Secondary | ICD-10-CM | POA: Diagnosis not present

## 2015-03-06 DIAGNOSIS — E039 Hypothyroidism, unspecified: Secondary | ICD-10-CM

## 2015-03-06 DIAGNOSIS — K5790 Diverticulosis of intestine, part unspecified, without perforation or abscess without bleeding: Secondary | ICD-10-CM | POA: Diagnosis not present

## 2015-03-06 DIAGNOSIS — Z806 Family history of leukemia: Secondary | ICD-10-CM | POA: Insufficient documentation

## 2015-03-06 DIAGNOSIS — Z85828 Personal history of other malignant neoplasm of skin: Secondary | ICD-10-CM

## 2015-03-06 DIAGNOSIS — Z85038 Personal history of other malignant neoplasm of large intestine: Secondary | ICD-10-CM

## 2015-03-06 LAB — COMPREHENSIVE METABOLIC PANEL
ALT: 12 U/L — AB (ref 14–54)
AST: 21 U/L (ref 15–41)
Albumin: 3.9 g/dL (ref 3.5–5.0)
Alkaline Phosphatase: 59 U/L (ref 38–126)
Anion gap: 7 (ref 5–15)
BILIRUBIN TOTAL: 0.4 mg/dL (ref 0.3–1.2)
BUN: 25 mg/dL — AB (ref 6–20)
CHLORIDE: 109 mmol/L (ref 101–111)
CO2: 23 mmol/L (ref 22–32)
CREATININE: 1.37 mg/dL — AB (ref 0.44–1.00)
Calcium: 9.1 mg/dL (ref 8.9–10.3)
GFR calc Af Amer: 41 mL/min — ABNORMAL LOW (ref >60)
GFR, EST NON AFRICAN AMERICAN: 35 mL/min — AB (ref >60)
GLUCOSE: 97 mg/dL (ref 65–99)
Potassium: 4.6 mmol/L (ref 3.5–5.1)
SODIUM: 139 mmol/L (ref 135–145)
TOTAL PROTEIN: 6.9 g/dL (ref 6.5–8.1)

## 2015-03-06 LAB — CBC WITH DIFFERENTIAL/PLATELET
BASOS ABS: 0 10*3/uL (ref 0–0.1)
Basophils Relative: 1 %
EOS ABS: 0.2 10*3/uL (ref 0–0.7)
EOS PCT: 2 %
HCT: 33.8 % — ABNORMAL LOW (ref 35.0–47.0)
Hemoglobin: 11.3 g/dL — ABNORMAL LOW (ref 12.0–16.0)
Lymphocytes Relative: 24 %
Lymphs Abs: 1.6 10*3/uL (ref 1.0–3.6)
MCH: 31.1 pg (ref 26.0–34.0)
MCHC: 33.5 g/dL (ref 32.0–36.0)
MCV: 92.9 fL (ref 80.0–100.0)
Monocytes Absolute: 0.7 10*3/uL (ref 0.2–0.9)
Monocytes Relative: 10 %
Neutro Abs: 4.2 10*3/uL (ref 1.4–6.5)
Neutrophils Relative %: 63 %
PLATELETS: 178 10*3/uL (ref 150–440)
RBC: 3.64 MIL/uL — AB (ref 3.80–5.20)
RDW: 12.5 % (ref 11.5–14.5)
WBC: 6.7 10*3/uL (ref 3.6–11.0)

## 2015-03-06 MED ORDER — TEMAZEPAM 15 MG PO CAPS: ORAL_CAPSULE | ORAL | Status: DC

## 2015-03-06 NOTE — Progress Notes (Signed)
Patient requesting refill for Temazepam. 

## 2015-03-07 DIAGNOSIS — F419 Anxiety disorder, unspecified: Secondary | ICD-10-CM | POA: Insufficient documentation

## 2015-03-07 DIAGNOSIS — N951 Menopausal and female climacteric states: Secondary | ICD-10-CM | POA: Insufficient documentation

## 2015-03-07 DIAGNOSIS — C4491 Basal cell carcinoma of skin, unspecified: Secondary | ICD-10-CM | POA: Insufficient documentation

## 2015-03-07 DIAGNOSIS — F329 Major depressive disorder, single episode, unspecified: Secondary | ICD-10-CM | POA: Insufficient documentation

## 2015-03-07 DIAGNOSIS — K5792 Diverticulitis of intestine, part unspecified, without perforation or abscess without bleeding: Secondary | ICD-10-CM | POA: Insufficient documentation

## 2015-03-07 DIAGNOSIS — I129 Hypertensive chronic kidney disease with stage 1 through stage 4 chronic kidney disease, or unspecified chronic kidney disease: Secondary | ICD-10-CM | POA: Insufficient documentation

## 2015-03-07 DIAGNOSIS — I10 Essential (primary) hypertension: Secondary | ICD-10-CM | POA: Insufficient documentation

## 2015-03-07 DIAGNOSIS — F32A Depression, unspecified: Secondary | ICD-10-CM | POA: Insufficient documentation

## 2015-03-07 DIAGNOSIS — E039 Hypothyroidism, unspecified: Secondary | ICD-10-CM | POA: Insufficient documentation

## 2015-03-07 LAB — CEA: CEA: 3.2 ng/mL (ref 0.0–4.7)

## 2015-03-08 ENCOUNTER — Encounter: Payer: Self-pay | Admitting: Unknown Physician Specialty

## 2015-03-08 ENCOUNTER — Ambulatory Visit (INDEPENDENT_AMBULATORY_CARE_PROVIDER_SITE_OTHER): Payer: Medicare Other | Admitting: Unknown Physician Specialty

## 2015-03-08 VITALS — BP 122/76 | HR 81 | Temp 97.9°F | Ht 64.3 in | Wt 140.8 lb

## 2015-03-08 DIAGNOSIS — I10 Essential (primary) hypertension: Secondary | ICD-10-CM | POA: Diagnosis not present

## 2015-03-08 DIAGNOSIS — Z23 Encounter for immunization: Secondary | ICD-10-CM

## 2015-03-08 DIAGNOSIS — E039 Hypothyroidism, unspecified: Secondary | ICD-10-CM

## 2015-03-08 MED ORDER — LEVOTHYROXINE SODIUM 100 MCG PO TABS: 100.0000 ug | ORAL_TABLET | Freq: Every day | ORAL | Status: DC

## 2015-03-08 MED ORDER — RALOXIFENE HCL 60 MG PO TABS: 60.0000 mg | ORAL_TABLET | Freq: Every day | ORAL | Status: DC

## 2015-03-08 MED ORDER — LISINOPRIL 20 MG PO TABS: 20.0000 mg | ORAL_TABLET | Freq: Every day | ORAL | Status: DC

## 2015-03-08 NOTE — Assessment & Plan Note (Signed)
Stable, continue present medications.   

## 2015-03-08 NOTE — Progress Notes (Signed)
   BP 122/76 mmHg  Pulse 81  Temp(Src) 97.9 F (36.6 C)  Ht 5' 4.3" (1.633 m)  Wt 140 lb 12.8 oz (63.866 kg)  BMI 23.95 kg/m2  SpO2 100%   Subjective:    Patient ID: Sara Cannon, female    DOB: Mar 16, 1933, 81 y.o.   MRN: [id number]  HPI: Sara Cannon is a 81 y.o. female  Chief Complaint  Patient presents with  . Medication Refill    pt states she needs refills on lisinopril, raloxifene, and synthroid. Pt wants to be switched to generic synthroid if possible.   . other    pt states she had labs drawn Monday at cancer center and wants to know if they would be good for today's visit as well. Labs are in chart under chart review.   Hypertension Using medications without difficulty Average home BPs  120's/70's  No problems or lightheadedness No chest pain with exertion or shortness of breath No Edema  Hypothyroid Wants to be switched to generic.  She is doing will with good energy and stable weight.     Relevant past medical, surgical, family and social history reviewed and updated as indicated. Interim medical history since our last visit reviewed. Allergies and medications reviewed and updated.  Review of Systems  Per HPI unless specifically indicated above     Objective:    BP 122/76 mmHg  Pulse 81  Temp(Src) 97.9 F (36.6 C)  Ht 5' 4.3" (1.633 m)  Wt 140 lb 12.8 oz (63.866 kg)  BMI 23.95 kg/m2  SpO2 100%  Wt Readings from Last 3 Encounters:  03/08/15 140 lb 12.8 oz (63.866 kg)  03/04/14 136 lb (61.689 kg)  03/06/15 140 lb (63.504 kg)    Physical Exam  Constitutional: She is oriented to person, place, and time. She appears well-developed and well-nourished. No distress.  HENT:  Head: Normocephalic and atraumatic.  Eyes: Conjunctivae and lids are normal. Right eye exhibits no discharge. Left eye exhibits no discharge. No scleral icterus.  Cardiovascular: Normal rate, regular rhythm and normal heart sounds.   Pulmonary/Chest: Effort normal and breath  sounds normal. No respiratory distress.  Abdominal: Normal appearance. There is no splenomegaly or hepatomegaly.  Musculoskeletal: Normal range of motion.  Neurological: She is alert and oriented to person, place, and time.  Skin: Skin is intact. No rash noted. No pallor.  Psychiatric: She has a normal mood and affect. Her behavior is normal. Judgment and thought content normal.      Assessment & Plan:   Problem List Items Addressed This Visit      Unprioritized   Hypertension    Stable, continue present medications.       Hypothyroidism    Change to generic and recheck labs in 3 months       Other Visit Diagnoses    Immunization due    -  Primary    Relevant Orders    Flu Vaccine QUAD 36+ mos IM (Completed)        Follow up plan: Return in about 3 months (around 06/08/2015) for Recheck thyroid.

## 2015-03-08 NOTE — Assessment & Plan Note (Signed)
Change to generic and recheck labs in 3 months

## 2015-03-09 ENCOUNTER — Telehealth: Payer: Self-pay | Admitting: *Deleted

## 2015-03-09 DIAGNOSIS — Z85038 Personal history of other malignant neoplasm of large intestine: Secondary | ICD-10-CM

## 2015-03-09 DIAGNOSIS — I10 Essential (primary) hypertension: Secondary | ICD-10-CM | POA: Diagnosis not present

## 2015-03-09 DIAGNOSIS — N261 Atrophy of kidney (terminal): Secondary | ICD-10-CM | POA: Diagnosis not present

## 2015-03-09 DIAGNOSIS — N183 Chronic kidney disease, stage 3 (moderate): Secondary | ICD-10-CM | POA: Diagnosis not present

## 2015-03-09 MED ORDER — TEMAZEPAM 15 MG PO CAPS: ORAL_CAPSULE | ORAL | Status: DC

## 2015-03-09 NOTE — Telephone Encounter (Signed)
Called patient to let her know prescription has been faxed.  She states she checked with pharmacy today and they did not have it.  Informed her we would refax it today.

## 2015-03-09 NOTE — Telephone Encounter (Signed)
Requesting refill for Temazepam.

## 2015-03-09 NOTE — Addendum Note (Signed)
Addended by: Telford Nab on: 03/09/2015 04:47 PM   Modules accepted: Orders

## 2015-03-09 NOTE — Telephone Encounter (Signed)
rx was faxed to [facility] on 11/7

## 2015-03-12 ENCOUNTER — Encounter: Payer: Self-pay | Admitting: Oncology

## 2015-03-12 NOTE — Progress Notes (Signed)
Sara Cannon @ [facility] [phone]  Fax:([phone]) [place] [facility]: 11/05/1932  MR#: [id number]  JYN#:[id number]  Patient Care Team: Guadalupe Maple, MD as PCP - General (Family Medicine) Robert Bellow, MD (General Surgery) Guadalupe Maple, MD (Family Medicine)  CHIEF COMPLAINT:  Chief Complaint  Patient presents with  . OTHER   Chief Complaint/Diagnosis:   carcinoma of colon, descending colon, and T3, N0, M0 tumor stage II pain with history of obstructionstatus postchemotherapy with FOLFOX diagnosis was in 2007  No flowsheet data found.  INTERVAL HISTORY: 81 year old lady came today further follow-up regarding stage II carcinoma of colon status post chemotherapy under protocol No chills fever.  Neuropathy is stable. Getting regular colonoscopy done. Getting regular mammogram done. No rectal bleeding  REVIEW OF SYSTEMS:   GENERAL:  Feels good.  Active.  No fevers, sweats or weight loss. PERFORMANCE STATUS (ECOG):  0 HEENT:  No visual changes, runny nose, sore throat, mouth sores or tenderness. Lungs: No shortness of breath or cough.  No hemoptysis. Cardiac:  No chest pain, palpitations, orthopnea, or PND. GI:  No nausea, vomiting, diarrhea, constipation, melena or hematochezia. GU:  No urgency, frequency, dysuria, or hematuria. Musculoskeletal:  No back pain.  No joint pain.  No muscle tenderness. Extremities:  No pain or swelling. Skin:  No rashes or skin changes. Neuro:  No headache, numbness or weakness, balance or coordination issues. Endocrine:  No diabetes, thyroid issues, hot flashes or night sweats. Psych:  No mood changes, depression or anxiety. Pain:  No focal pain. Review of systems:  All other systems reviewed and found to be negative. As per HPI. Otherwise, a complete review of systems is negatve.  PAST MEDICAL HISTORY: Past Medical History  Diagnosis Date  . Renal failure May 2015    Dr Candiss Norse  . 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  . Menopausal state   . Diverticulitis   . Cancer [facility]) 2007    Descending colon, T3, N0. Adjuvant chemotherapy.  . Basal cell carcinoma   . Depression   . Anxiety   . Hypertensive chronic kidney disease   . Hypertension   . Hypothyroidism     PAST SURGICAL HISTORY: Past Surgical History  Procedure Laterality Date  . Colonoscopy  2008, 2011    Dr Bary Castilla, hyperplastic rectal polyps identified May 2008.  . Colon resection  2007  . Portacath placement  2007  . Port-a-cath removal  2009  . Eye surgery    . Colonoscopy with propofol N/A 12/14/2014    Procedure: COLONOSCOPY WITH PROPOFOL;  Surgeon: Robert Bellow, MD;  Location: [facility] ENDOSCOPY;  Service: Endoscopy;  Laterality: N/A;  . Cataract extraction      FAMILY HISTORY Family History  Problem Relation Age of Onset  . Heart disease Mother   . Congestive Heart Failure Mother   . Hypertension Mother   . Cancer Father     leukemia  . Tremor Brother   . Tremor Maternal Grandfather   . Tremor Brother     ADVANCED DIRECTIVES:  No flowsheet data found.  HEALTH MAINTENANCE: Social History  Substance Use Topics  . Smoking status: Never Smoker   . Smokeless tobacco: Never Used  . Alcohol Use: No   Significant History/PMH:   Hypertension:    Hypothyroidism:    Colon or Rectal Cancer:    Diverticulitis:   Smoking History: Smoking History Never Smoked.  PFSH: Family History: noncontributory  Social History: noncontributory  Additional  Past Medical and Surgical History: no significant past medical historyor surgical history.  Does not smoke. Does not drink      No Known Allergies  Current Outpatient Prescriptions  Medication Sig Dispense Refill  . aspirin 81 MG tablet Take 81 mg by mouth daily.    . polyethylene glycol powder (GLYCOLAX/MIRALAX) powder Take 255 g by mouth once. 255 g 0  . levothyroxine (SYNTHROID, LEVOTHROID) 100 MCG tablet Take 1 tablet (100 mcg total) by mouth daily. 90  tablet 1  . lisinopril (PRINIVIL,ZESTRIL) 20 MG tablet Take 1 tablet (20 mg total) by mouth daily. 90 tablet 0  . raloxifene (EVISTA) 60 MG tablet Take 1 tablet (60 mg total) by mouth daily. 90 tablet 3  . temazepam (RESTORIL) 15 MG capsule Take 1-2 capsules PO daily at bedtime as needed 30 capsule 1   No current facility-administered medications for this visit.    OBJECTIVE:  Filed Vitals:   03/06/15 1421  BP: 132/71  Pulse: 71  Temp: 96.6 F (35.9 C)     Body mass index is 22.26 kg/(m^2).    ECOG FS:0 - Asymptomatic  PHYSICAL EXAM: GENERAL:  Well developed, well nourished, sitting comfortably in the exam room in no acute distress. MENTAL STATUS:  Alert and oriented to person, place and time. .  RESPIRATORY:  Clear to auscultation without rales, wheezes or rhonchi. CARDIOVASCULAR:  Regular rate and rhythm without murmur, rub or gallop. BREAST:  Right breast without masses, skin changes or nipple discharge.  Left breast without masses, skin changes or nipple discharge. ABDOMEN:  Soft, non-tender, with active bowel sounds, and no hepatosplenomegaly.  No masses. BACK:  No CVA tenderness.  No tenderness on percussion of the back or rib cage. SKIN:  No rashes, ulcers or lesions. EXTREMITIES: No edema, no skin discoloration or tenderness.  No palpable cords. LYMPH NODES: No palpable cervical, supraclavicular, axillary or inguinal adenopathy  NEUROLOGICAL: Unremarkable. PSYCH:  Appropriate.   LAB RESULTS:  CBC Latest Ref Rng 03/06/2015 02/28/2014  WBC 3.6 - 11.0 K/uL 6.7 9.2  Hemoglobin 12.0 - 16.0 g/dL 11.3(L) 11.5(L)  Hematocrit 35.0 - 47.0 % 33.8(L) 34.9(L)  Platelets 150 - 440 K/uL 178 177    Appointment on 03/06/2015  Component Date Value Ref Range Status  . WBC 03/06/2015 6.7  3.6 - 11.0 K/uL Final  . RBC 03/06/2015 3.64* 3.80 - 5.20 MIL/uL Final  . Hemoglobin 03/06/2015 11.3* 12.0 - 16.0 g/dL Final  . HCT 03/06/2015 33.8* 35.0 - 47.0 % Final  . MCV 03/06/2015 92.9   80.0 - 100.0 fL Final  . MCH 03/06/2015 31.1  26.0 - 34.0 pg Final  . MCHC 03/06/2015 33.5  32.0 - 36.0 g/dL Final  . RDW 03/06/2015 12.5  11.5 - 14.5 % Final  . Platelets 03/06/2015 178  150 - 440 K/uL Final  . Neutrophils Relative % 03/06/2015 63   Final  . Neutro Abs 03/06/2015 4.2  1.4 - 6.5 K/uL Final  . Lymphocytes Relative 03/06/2015 24   Final  . Lymphs Abs 03/06/2015 1.6  1.0 - 3.6 K/uL Final  . Monocytes Relative 03/06/2015 10   Final  . Monocytes Absolute 03/06/2015 0.7  0.2 - 0.9 K/uL Final  . Eosinophils Relative 03/06/2015 2   Final  . Eosinophils Absolute 03/06/2015 0.2  0 - 0.7 K/uL Final  . Basophils Relative 03/06/2015 1   Final  . Basophils Absolute 03/06/2015 0.0  0 - 0.1 K/uL Final  . Sodium 03/06/2015 139  135 - 145  mmol/L Final  . Potassium 03/06/2015 4.6  3.5 - 5.1 mmol/L Final  . Chloride 03/06/2015 109  101 - 111 mmol/L Final  . CO2 03/06/2015 23  22 - 32 mmol/L Final  . Glucose, Bld 03/06/2015 97  65 - 99 mg/dL Final  . BUN 03/06/2015 25* 6 - 20 mg/dL Final  . Creatinine, Ser 03/06/2015 1.37* 0.44 - 1.00 mg/dL Final  . Calcium 03/06/2015 9.1  8.9 - 10.3 mg/dL Final  . Total Protein 03/06/2015 6.9  6.5 - 8.1 g/dL Final  . Albumin 03/06/2015 3.9  3.5 - 5.0 g/dL Final  . AST 03/06/2015 21  15 - 41 U/L Final  . ALT 03/06/2015 12* 14 - 54 U/L Final  . Alkaline Phosphatase 03/06/2015 59  38 - 126 U/L Final  . Total Bilirubin 03/06/2015 0.4  0.3 - 1.2 mg/dL Final  . GFR calc non Af Amer 03/06/2015 35* >60 mL/min Final  . GFR calc Af Amer 03/06/2015 41* >60 mL/min Final   Comment: (NOTE) The eGFR has been calculated using the CKD EPI equation. This calculation has not been validated in all clinical situations. eGFR's persistently <60 mL/min signify possible Chronic Kidney Disease.   . Anion gap 03/06/2015 7  5 - 15 Final  . CEA 03/06/2015 3.2  0.0 - 4.7 ng/mL Final   Comment: (NOTE)       Roche ECLIA methodology       Nonsmokers  <3.9                                      Smokers     <5.6 Performed At: [facility] [place], [place] [id number] Lindon Romp MD IP:[phone]       ASSESSMENT: Carcinoma of: There is no evidence of recurrent disease status post chemotherapy with FOLFOX.  (Patient was on CALGB protocol) Neuropathy is stable CEA stable MEDICAL DECISION MAKING:  Patient has been discharged from my care.  Was advised to continue regular follow-up with primary care physician All lab data has been reviewed Patient expressed understanding and was in agreement with this plan. She also understands that She can call clinic at any time with any questions, concerns, or complaints.    No matching staging information was found for the patient.  Forest Gleason, MD   03/12/2015 5:12 PM

## 2015-03-13 DIAGNOSIS — Z1231 Encounter for screening mammogram for malignant neoplasm of breast: Secondary | ICD-10-CM | POA: Diagnosis not present

## 2015-04-25 ENCOUNTER — Other Ambulatory Visit: Payer: Self-pay | Admitting: Unknown Physician Specialty

## 2015-04-25 NOTE — Telephone Encounter (Signed)
She should not need a refill. She was given 6 months in November.

## 2015-04-25 NOTE — Telephone Encounter (Signed)
Called pharmacy and they stated that the prescription is a duplicate so we can ignore it. Dr. Wynetta Emery, can you unmark these medications so we can close the encounter? Thanks.

## 2015-06-07 ENCOUNTER — Telehealth: Payer: Self-pay | Admitting: *Deleted

## 2015-06-07 NOTE — Telephone Encounter (Signed)
Patient requesting refill for Temazepam 15 mg.  Asking for #90

## 2015-06-07 NOTE — Telephone Encounter (Signed)
Patient has been discharged from practice. Needs to get refill from PCP.

## 2015-06-07 NOTE — Telephone Encounter (Signed)
Called patient and left message that patient has been discharged from practice and will need to notify PCP for refill for temazepam.

## 2015-06-08 ENCOUNTER — Encounter: Payer: Medicare Other | Admitting: Family Medicine

## 2015-06-13 ENCOUNTER — Other Ambulatory Visit: Payer: Self-pay

## 2015-06-13 MED ORDER — LISINOPRIL 20 MG PO TABS: 20.0000 mg | ORAL_TABLET | Freq: Every day | ORAL | Status: DC

## 2015-06-13 NOTE — Telephone Encounter (Signed)
Patient was last seen 03/08/15 and has appointment 08/06/15 for a physical.

## 2015-07-06 ENCOUNTER — Encounter: Payer: Self-pay | Admitting: *Deleted

## 2015-07-31 ENCOUNTER — Ambulatory Visit (INDEPENDENT_AMBULATORY_CARE_PROVIDER_SITE_OTHER): Payer: PPO | Admitting: Unknown Physician Specialty

## 2015-07-31 ENCOUNTER — Encounter: Payer: Self-pay | Admitting: Unknown Physician Specialty

## 2015-07-31 VITALS — BP 112/69 | HR 93 | Temp 97.6°F | Ht 65.2 in | Wt 139.8 lb

## 2015-07-31 DIAGNOSIS — F5101 Primary insomnia: Secondary | ICD-10-CM | POA: Insufficient documentation

## 2015-07-31 DIAGNOSIS — E039 Hypothyroidism, unspecified: Secondary | ICD-10-CM | POA: Diagnosis not present

## 2015-07-31 DIAGNOSIS — R197 Diarrhea, unspecified: Secondary | ICD-10-CM | POA: Diagnosis not present

## 2015-07-31 DIAGNOSIS — I10 Essential (primary) hypertension: Secondary | ICD-10-CM | POA: Diagnosis not present

## 2015-07-31 DIAGNOSIS — G4700 Insomnia, unspecified: Secondary | ICD-10-CM | POA: Insufficient documentation

## 2015-07-31 MED ORDER — LEVOTHYROXINE SODIUM 100 MCG PO TABS: 100.0000 ug | ORAL_TABLET | Freq: Every day | ORAL | Status: DC

## 2015-07-31 MED ORDER — RALOXIFENE HCL 60 MG PO TABS: 60.0000 mg | ORAL_TABLET | Freq: Every day | ORAL | Status: DC

## 2015-07-31 MED ORDER — LISINOPRIL 20 MG PO TABS: 20.0000 mg | ORAL_TABLET | Freq: Every day | ORAL | Status: DC

## 2015-07-31 NOTE — Assessment & Plan Note (Signed)
Stable, continue present medications.   

## 2015-07-31 NOTE — Assessment & Plan Note (Signed)
Insomnia for sleep hygeine

## 2015-07-31 NOTE — Patient Instructions (Addendum)
Diarrhea Diarrhea is frequent loose and watery bowel movements. It can cause you to feel weak and dehydrated. Dehydration can cause you to become tired and thirsty, have a dry mouth, and have decreased urination that often is dark yellow. Diarrhea is a sign of another problem, most often an infection that will not last long. In most cases, diarrhea typically lasts 2-3 days. However, it can last longer if it is a sign of something more serious. It is important to treat your diarrhea as directed by your caregiver to lessen or prevent future episodes of diarrhea. CAUSES  Some common causes include:  Gastrointestinal infections caused by viruses, bacteria, or parasites.  Food poisoning or food allergies.  Certain medicines, such as antibiotics, chemotherapy, and laxatives.  Artificial sweeteners and fructose.  Digestive disorders. HOME CARE INSTRUCTIONS  Ensure adequate fluid intake (hydration): Have 1 cup (8 oz) of fluid for each diarrhea episode. Avoid fluids that contain simple sugars or sports drinks, fruit juices, whole milk products, and sodas. Your urine should be clear or pale yellow if you are drinking enough fluids. Hydrate with an oral rehydration solution that you can purchase at pharmacies, retail stores, and online. You can prepare an oral rehydration solution at home by mixing the following ingredients together:   - tsp table salt.   tsp baking soda.   tsp salt substitute containing potassium chloride.  1  tablespoons sugar.  1 L (34 oz) of water.  Certain foods and beverages may increase the speed at which food moves through the gastrointestinal (GI) tract. These foods and beverages should be avoided and include:  Caffeinated and alcoholic beverages.  High-fiber foods, such as raw fruits and vegetables, nuts, seeds, and whole grain breads and cereals.  Foods and beverages sweetened with sugar alcohols, such as xylitol, sorbitol, and mannitol.  Some foods may be well  tolerated and may help thicken stool including:  Starchy foods, such as rice, toast, pasta, low-sugar cereal, oatmeal, grits, baked potatoes, crackers, and bagels.  Bananas.  Applesauce.  Add probiotic-rich foods to help increase healthy bacteria in the GI tract, such as yogurt and fermented milk products.  Wash your hands well after each diarrhea episode.  Only take over-the-counter or prescription medicines as directed by your caregiver.  Take a warm bath to relieve any burning or pain from frequent diarrhea episodes. SEEK IMMEDIATE MEDICAL CARE IF:   You are unable to keep fluids down.  You have persistent vomiting.  You have blood in your stool, or your stools are black and tarry.  You do not urinate in 6-8 hours, or there is only a small amount of very dark urine.  You have abdominal pain that increases or localizes.  You have weakness, dizziness, confusion, or light-headedness.  You have a severe headache.  Your diarrhea gets worse or does not get better.  You have a fever or persistent symptoms for more than 2-3 days.  You have a fever and your symptoms suddenly get worse. MAKE SURE YOU:   Understand these instructions.  Will watch your condition.  Will get help right away if you are not doing well or get worse.   This information is not intended to replace advice given to you by your health care provider. Make sure you discuss any questions you have with your health care provider.   BRAT diet Bannanas, rice, applesauce, and toast     Insomnia Insomnia is a sleep disorder that makes it difficult to fall asleep or to stay asleep.  Insomnia can cause tiredness (fatigue), low energy, difficulty concentrating, mood swings, and poor performance at work or school.  There are three different ways to classify insomnia:  Difficulty falling asleep.  Difficulty staying asleep.  Waking up too early in the morning. Any type of insomnia can be long-term  (chronic) or short-term (acute). Both are common. Short-term insomnia usually lasts for three months or less. Chronic insomnia occurs at least three times a week for longer than three months. CAUSES  Insomnia may be caused by another condition, situation, or substance, such as:  Anxiety.  Certain medicines.  Gastroesophageal reflux disease (GERD) or other gastrointestinal conditions.  Asthma or other breathing conditions.  Restless legs syndrome, sleep apnea, or other sleep disorders.  Chronic pain.  Menopause. This may include hot flashes.  Stroke.  Abuse of alcohol, tobacco, or illegal drugs.  Depression.  Caffeine.   Neurological disorders, such as Alzheimer disease.  An overactive thyroid (hyperthyroidism). The cause of insomnia may not be known. RISK FACTORS Risk factors for insomnia include:  Gender. Women are more commonly affected than men.  Age. Insomnia is more common as you get older.  Stress. This may involve your professional or personal life.  Income. Insomnia is more common in people with lower income.  Lack of exercise.   Irregular work schedule or night shifts.  Traveling between different time zones. SIGNS AND SYMPTOMS If you have insomnia, trouble falling asleep or trouble staying asleep is the main symptom. This may lead to other symptoms, such as:  Feeling fatigued.  Feeling nervous about going to sleep.  Not feeling rested in the morning.  Having trouble concentrating.  Feeling irritable, anxious, or depressed. TREATMENT  Treatment for insomnia depends on the cause. If your insomnia is caused by an underlying condition, treatment will focus on addressing the condition. Treatment may also include:   Medicines to help you sleep.  Counseling or therapy.  Lifestyle adjustments. HOME CARE INSTRUCTIONS   Take medicines only as directed by your health care provider.  Keep regular sleeping and waking hours. Avoid naps.  Keep a  sleep diary to help you and your health care provider figure out what could be causing your insomnia. Include:   When you sleep.  When you wake up during the night.  How well you sleep.   How rested you feel the next day.  Any side effects of medicines you are taking.  What you eat and drink.   Make your bedroom a comfortable place where it is easy to fall asleep:  Put up shades or special blackout curtains to block light from outside.  Use a white noise machine to block noise.  Keep the temperature cool.   Exercise regularly as directed by your health care provider. Avoid exercising right before bedtime.  Use relaxation techniques to manage stress. Ask your health care provider to suggest some techniques that may work well for you. These may include:  Breathing exercises.  Routines to release muscle tension.  Visualizing peaceful scenes.  Cut back on alcohol, caffeinated beverages, and cigarettes, especially close to bedtime. These can disrupt your sleep.  Do not overeat or eat spicy foods right before bedtime. This can lead to digestive discomfort that can make it hard for you to sleep.  Limit screen use before bedtime. This includes:  Watching TV.  Using your smartphone, tablet, and computer.  Stick to a routine. This can help you fall asleep faster. Try to do a quiet activity, brush your teeth, and  go to bed at the same time each night.  Get out of bed if you are still awake after 15 minutes of trying to sleep. Keep the lights down, but try reading or doing a quiet activity. When you feel sleepy, go back to bed.  Make sure that you drive carefully. Avoid driving if you feel very sleepy.  Keep all follow-up appointments as directed by your health care provider. This is important. SEEK MEDICAL CARE IF:   You are tired throughout the day or have trouble in your daily routine due to sleepiness.  You continue to have sleep problems or your sleep problems get  worse. SEEK IMMEDIATE MEDICAL CARE IF:   You have serious thoughts about hurting yourself or someone else.   This information is not intended to replace advice given to you by your health care provider. Make sure you discuss any questions you have with your health care provider.   Document Released: 04/12/2000 Document Revised: 01/04/2015 Document Reviewed: 01/14/2014 Elsevier Interactive Patient Education [employer].

## 2015-07-31 NOTE — Progress Notes (Signed)
---------------------------------------------------------------------------------------------------------------------------------------------------------------------------------------------------  BP 112/69 mmHg  Pulse 93  Temp(Src) 97.6 F (36.4 C)  Ht 5' 5.2" (1.656 m)  Wt 139 lb 12.8 oz (63.413 kg)  BMI 23.12 kg/m2  SpO2 98%  LMP  (LMP Unknown)   Subjective:    Patient ID: Sara Cannon, female    DOB: 14-Jun-1932, 82 y.o.   MRN: [id number]  HPI: Sara Cannon is a 82 y.o. female  Chief Complaint  Patient presents with  . Diarrhea    pt states she has been having diarrhea for about 2 weeks  . lab work    pt states she is due to have thyroid checked   Pt states that she has diarrhea about 3 hours after eating.  She is taking Immodium.  States she eats twice and has voluminous diarrhea each time.  This has a bad odor.  No recent antibiotics.  No blood.  Denies dehydration.  She is drinking fluids.  Not losing weight and not having diarrhea in the middle of the night.  No fever or vomiting.  Significant history of colono cancer and last colonoscopy was August.    Hypothyroid Changed to generic Synthroid and needs labs  CKD Last GFR was 35.  She is due for appt with Nephrology but they keep on cancelling.  Would like it checked.    Hypertension Using medications without difficulty  No problems or lightheadedness No chest pain with exertion or shortness of breath No Edema  Insomnia Pt with insomnia ever since she has had a cancer diagnosis.  She does fall asleep on the couch.     Relevant past medical, surgical, family and social history reviewed and updated as indicated. Interim medical history since our last visit reviewed. Allergies and medications reviewed and updated.  Review of Systems  Per HPI unless specifically indicated above     Objective:    BP 112/69 mmHg  Pulse 93  Temp(Src) 97.6 F (36.4 C)  Ht 5' 5.2" (1.656 m)  Wt 139 lb 12.8 oz (63.413 kg)   BMI 23.12 kg/m2  SpO2 98%  LMP  (LMP Unknown)  Wt Readings from Last 3 Encounters:  07/31/15 139 lb 12.8 oz (63.413 kg)  03/08/15 140 lb 12.8 oz (63.866 kg)  03/04/14 136 lb (61.689 kg)    Physical Exam  Constitutional: She is oriented to person, place, and time. She appears well-developed and well-nourished. No distress.  HENT:  Head: Normocephalic and atraumatic.  Eyes: Conjunctivae and lids are normal. Right eye exhibits no discharge. Left eye exhibits no discharge. No scleral icterus.  Neck: Normal range of motion. Neck supple. No JVD present. Carotid bruit is not present.  Cardiovascular: Normal rate, regular rhythm and normal heart sounds.   Pulmonary/Chest: Effort normal and breath sounds normal.  Abdominal: Normal appearance. There is no splenomegaly or hepatomegaly.  Musculoskeletal: Normal range of motion.  Neurological: She is alert and oriented to person, place, and time.  Skin: Skin is warm, dry and intact. No rash noted. No pallor.  Psychiatric: She has a normal mood and affect. Her behavior is normal. Judgment and thought content normal.      Assessment & Plan:   Problem List Items Addressed This Visit      Unprioritized   Hypertension    Stable, continue present medications.        Relevant Medications   lisinopril (PRINIVIL,ZESTRIL) 20 MG tablet   Hypothyroidism   Relevant Medications   levothyroxine (SYNTHROID, LEVOTHROID) 100 MCG tablet   Other  Relevant Orders   TSH   Insomnia    Insomnia for sleep hygeine       Other Visit Diagnoses    Diarrhea, unspecified type    -  Primary    Relevant Orders    Stool Culture    Stool C-Diff Toxin Assay        Follow up plan: Return in about 6 months (around 01/30/2016), or if symptoms worsen or fail to improve.

## 2015-08-01 ENCOUNTER — Encounter: Payer: Self-pay | Admitting: Unknown Physician Specialty

## 2015-08-01 LAB — TSH: TSH: 1.5 u[IU]/mL (ref 0.450–4.500)

## 2015-08-03 LAB — CLOSTRIDIUM DIFFICILE EIA: C difficile Toxins A+B, EIA: NEGATIVE

## 2015-08-04 ENCOUNTER — Telehealth: Payer: Self-pay | Admitting: Unknown Physician Specialty

## 2015-08-04 ENCOUNTER — Other Ambulatory Visit: Payer: PPO

## 2015-08-04 DIAGNOSIS — R197 Diarrhea, unspecified: Secondary | ICD-10-CM

## 2015-08-04 LAB — CBC WITH DIFFERENTIAL/PLATELET
HEMOGLOBIN: 11.5 g/dL (ref 11.1–15.9)
Hematocrit: 32.6 % — ABNORMAL LOW (ref 34.0–46.6)
Lymphocytes Absolute: 1.4 10*3/uL (ref 0.7–3.1)
Lymphs: 18 %
MCH: 32.7 pg (ref 26.6–33.0)
MCHC: 35.3 g/dL (ref 31.5–35.7)
MCV: 93 fL (ref 79–97)
MID (ABSOLUTE): 0.8 10*3/uL (ref 0.1–1.6)
MID: 10 %
NEUTROS ABS: 5.3 10*3/uL (ref 1.4–7.0)
Neutrophils: 71 %
Platelets: 181 10*3/uL (ref 150–379)
RBC: 3.52 x10E6/uL — AB (ref 3.77–5.28)
RDW: 12.9 % (ref 12.3–15.4)
WBC: 7.5 10*3/uL (ref 3.4–10.8)

## 2015-08-04 NOTE — Telephone Encounter (Signed)
Called pt today about her concerns of diarrhea.  It is now once a day, but with her history of colon cancer, she would like to get this evaluated asap.  Wel will check a CBC and CMP.  Refer for an urgent appt with [facility]

## 2015-08-05 LAB — COMPREHENSIVE METABOLIC PANEL
A/G RATIO: 1.6 (ref 1.2–2.2)
ALK PHOS: 66 IU/L (ref 39–117)
ALT: 14 IU/L (ref 0–32)
AST: 24 IU/L (ref 0–40)
Albumin: 4.2 g/dL (ref 3.5–4.7)
BILIRUBIN TOTAL: 0.3 mg/dL (ref 0.0–1.2)
BUN / CREAT RATIO: 11 — AB (ref 12–28)
BUN: 17 mg/dL (ref 8–27)
CHLORIDE: 103 mmol/L (ref 96–106)
CO2: 20 mmol/L (ref 18–29)
Calcium: 9 mg/dL (ref 8.7–10.3)
Creatinine, Ser: 1.53 mg/dL — ABNORMAL HIGH (ref 0.57–1.00)
GFR calc non Af Amer: 32 mL/min/{1.73_m2} — ABNORMAL LOW (ref >59)
GFR, EST AFRICAN AMERICAN: 37 mL/min/{1.73_m2} — AB (ref >59)
Globulin, Total: 2.7 g/dL (ref 1.5–4.5)
Glucose: 99 mg/dL (ref 65–99)
POTASSIUM: 4.7 mmol/L (ref 3.5–5.2)
Sodium: 139 mmol/L (ref 134–144)
TOTAL PROTEIN: 6.9 g/dL (ref 6.0–8.5)

## 2015-08-06 LAB — STOOL CULTURE: E coli, Shiga toxin Assay: NEGATIVE

## 2015-08-07 ENCOUNTER — Encounter: Payer: Medicare Other | Admitting: Family Medicine

## 2015-08-10 ENCOUNTER — Ambulatory Visit (INDEPENDENT_AMBULATORY_CARE_PROVIDER_SITE_OTHER): Payer: PPO | Admitting: Gastroenterology

## 2015-08-10 ENCOUNTER — Encounter: Payer: Self-pay | Admitting: Gastroenterology

## 2015-08-10 VITALS — BP 132/68 | HR 72 | Ht 64.25 in | Wt 136.1 lb

## 2015-08-10 DIAGNOSIS — R197 Diarrhea, unspecified: Secondary | ICD-10-CM

## 2015-08-10 DIAGNOSIS — A09 Infectious gastroenteritis and colitis, unspecified: Secondary | ICD-10-CM

## 2015-08-10 MED ORDER — SACCHAROMYCES BOULARDII 250 MG PO CAPS: 250.0000 mg | ORAL_CAPSULE | Freq: Two times a day (BID) | ORAL | Status: DC

## 2015-08-10 NOTE — Patient Instructions (Addendum)
You can try a probiotic. Some suggestions are: Florastor, Culturelle or Restora.   We did send a prescription for Florastor to [place], [place].

## 2015-08-10 NOTE — Progress Notes (Signed)
     08/10/2015 Sara Cannon [id number] 18-Feb-1933   History of Present Illness:  This is a pleasant 82 year old female who is new to our practice and was referred here by her PCP, Sara Cannon, for evaluation regarding "diarrhea".  The patient has a past medical history of colon cancer in 2007, T3, N0 with resection and adjuvant chemotherapy. She is up-to-date with her colonoscopies with her last one being in August 2016 by Dr. Bary Castilla at [facility] at which time the study was normal. Her anastomosis was seen at 20 cm and was unremarkable. She is here today with complaints of diarrhea for the past 3 weeks. She describes this as actually loose stools, mostly in the mornings. She says that she is only going about twice a day, but this has been affecting her daily routines and is out of the norm for her. She denies any other symptoms including abdominal pain, nausea, vomiting, rectal bleeding. Her PCP check C. difficile toxin and stool culture, which were both negative/normal. TSH normal as well. She has taken an occasional Imodium, which does help.  Current Medications, Allergies, Past Medical History, Past Surgical History, Family History and Social History were reviewed in [employer] record.   Physical Exam: BP 132/68 mmHg  Pulse 72  Ht 5' 4.25" (1.632 m)  Wt 136 lb 2 oz (61.746 kg)  BMI 23.18 kg/m2  LMP  (LMP Unknown) General: Well developed white female in no acute distress Head: Normocephalic and atraumatic Eyes:  Sclerae anicteric, conjunctiva pink  Ears: Normal auditory acuity Lungs: Clear throughout to auscultation Heart: Regular rate and rhythm Abdomen: Soft, non-distended.  Normal bowel sounds.  Non-tender. Musculoskeletal: Symmetrical with no gross deformities  Extremities: No edema  Neurological: Alert oriented x 4, grossly non-focal Psychological:  Alert and cooperative. Normal mood and affect  Assessment and  Recommendations: -Diarrhea:  Onset 3 weeks ago and improving.  Characterized by just a couple of loose stools per day with no other symptoms.  Cdiff toxin and stool culture negative.  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 Offered reassurance and told her that this may take several weeks for her bowels to return to normal. I suggest that she begin taking a daily probiotic to assist with this. No need for further evaluation at the current time. She is up-to-date on her colonoscopy. If she calls back after complying with 4 weeks a probiotic, then could consider trial of Xifaxan.

## 2015-08-14 NOTE — Progress Notes (Signed)
Thank you for sending this case to me. I have reviewed the entire note, and the outlined plan seems appropriate.  

## 2015-09-26 DIAGNOSIS — I10 Essential (primary) hypertension: Secondary | ICD-10-CM | POA: Diagnosis not present

## 2015-09-26 DIAGNOSIS — N261 Atrophy of kidney (terminal): Secondary | ICD-10-CM | POA: Diagnosis not present

## 2015-09-26 DIAGNOSIS — N183 Chronic kidney disease, stage 3 (moderate): Secondary | ICD-10-CM | POA: Diagnosis not present

## 2015-09-26 DIAGNOSIS — R3129 Other microscopic hematuria: Secondary | ICD-10-CM | POA: Diagnosis not present

## 2015-10-20 ENCOUNTER — Other Ambulatory Visit: Payer: Self-pay | Admitting: Unknown Physician Specialty

## 2016-01-30 ENCOUNTER — Ambulatory Visit (INDEPENDENT_AMBULATORY_CARE_PROVIDER_SITE_OTHER): Payer: PPO | Admitting: Unknown Physician Specialty

## 2016-01-30 ENCOUNTER — Encounter: Payer: Self-pay | Admitting: Unknown Physician Specialty

## 2016-01-30 VITALS — BP 123/73 | HR 79 | Temp 97.9°F | Ht 65.0 in | Wt 143.2 lb

## 2016-01-30 DIAGNOSIS — I10 Essential (primary) hypertension: Secondary | ICD-10-CM | POA: Diagnosis not present

## 2016-01-30 DIAGNOSIS — Z23 Encounter for immunization: Secondary | ICD-10-CM

## 2016-01-30 DIAGNOSIS — E039 Hypothyroidism, unspecified: Secondary | ICD-10-CM

## 2016-01-30 DIAGNOSIS — I129 Hypertensive chronic kidney disease with stage 1 through stage 4 chronic kidney disease, or unspecified chronic kidney disease: Secondary | ICD-10-CM | POA: Diagnosis not present

## 2016-01-30 MED ORDER — LEVOTHYROXINE SODIUM 100 MCG PO TABS: 100.0000 ug | ORAL_TABLET | Freq: Every day | ORAL | 1 refills | Status: DC

## 2016-01-30 MED ORDER — LISINOPRIL 20 MG PO TABS: 20.0000 mg | ORAL_TABLET | Freq: Every day | ORAL | 1 refills | Status: DC

## 2016-01-30 NOTE — Patient Instructions (Addendum)

## 2016-01-30 NOTE — Assessment & Plan Note (Signed)
Stable, continue present medications.   

## 2016-01-30 NOTE — Progress Notes (Signed)
BP 123/73 (BP Location: Left Arm, Patient Position: Sitting, Cuff Size: Normal)   Pulse 79   Temp 97.9 F (36.6 C)   Ht 5\' 5"  (1.651 m)   Wt 143 lb 3.2 oz (65 kg)   LMP  (LMP Unknown)   SpO2 99%   BMI 23.83 kg/m    Subjective:    Patient ID: Sara Cannon, female    DOB: Aug 31, 1932, 82 y.o.   MRN: [id number]  HPI: Sara Cannon is a 82 y.o. female  Chief Complaint  Patient presents with  . Hypothyroidism  . Hypertension  . Medication Refill    pt stated that she would like her raloxifene, lisnopril, levothryroxine   Hypertension Using medications without difficulty Average home BPs   No problems or lightheadedness No chest pain with exertion or shortness of breath No Edema  Hypothyroid No weight changes. Leg cramps at night.  Needs her TSH checked as she is on a different faormulation    Relevant past medical, surgical, family and social history reviewed and updated as indicated. Interim medical history since our last visit reviewed. Allergies and medications reviewed and updated.  Review of Systems  Per HPI unless specifically indicated above     Objective:    BP 123/73 (BP Location: Left Arm, Patient Position: Sitting, Cuff Size: Normal)   Pulse 79   Temp 97.9 F (36.6 C)   Ht 5\' 5"  (1.651 m)   Wt 143 lb 3.2 oz (65 kg)   LMP  (LMP Unknown)   SpO2 99%   BMI 23.83 kg/m   Wt Readings from Last 3 Encounters:  01/30/16 143 lb 3.2 oz (65 kg)  08/10/15 136 lb 2 oz (61.7 kg)  07/31/15 139 lb 12.8 oz (63.4 kg)    Physical Exam  Constitutional: She is oriented to person, place, and time. She appears well-developed and well-nourished. No distress.  HENT:  Head: Normocephalic and atraumatic.  Eyes: Conjunctivae and lids are normal. Right eye exhibits no discharge. Left eye exhibits no discharge. No scleral icterus.  Cardiovascular: Normal rate.   Pulmonary/Chest: Effort normal.  Abdominal: Normal appearance. There is no splenomegaly or hepatomegaly.    Musculoskeletal: Normal range of motion.  Neurological: She is alert and oriented to person, place, and time.  Skin: Skin is intact. No rash noted. No pallor.  Psychiatric: She has a normal mood and affect. Her behavior is normal. Judgment and thought content normal.    Results for orders placed or performed in visit on 08/04/15  CBC With Differential/Platelet  Result Value Ref Range   WBC 7.5 3.4 - 10.8 x10E3/uL   RBC 3.52 (L) 3.77 - 5.28 x10E6/uL   Hemoglobin 11.5 11.1 - 15.9 g/dL   Hematocrit 32.6 (L) 34.0 - 46.6 %   MCV 93 79 - 97 fL   MCH 32.7 26.6 - 33.0 pg   MCHC 35.3 31.5 - 35.7 g/dL   RDW 12.9 12.3 - 15.4 %   Platelets 181 150 - 379 x10E3/uL   Neutrophils 71 %   Lymphs 18 %   MID 10 %   Neutrophils Absolute 5.3 1.4 - 7.0 x10E3/uL   Lymphocytes Absolute 1.4 0.7 - 3.1 x10E3/uL   MID (Absolute) 0.8 0.1 - 1.6 X10E3/uL  Comprehensive metabolic panel  Result Value Ref Range   Glucose 99 65 - 99 mg/dL   BUN 17 8 - 27 mg/dL   Creatinine, Ser 1.53 (H) 0.57 - 1.00 mg/dL   GFR calc non Af Amer 32 (L) >  59 mL/min/1.73   GFR calc Af Amer 37 (L) >59 mL/min/1.73   BUN/Creatinine Ratio 11 (L) 12 - 28   Sodium 139 134 - 144 mmol/L   Potassium 4.7 3.5 - 5.2 mmol/L   Chloride 103 96 - 106 mmol/L   CO2 20 18 - 29 mmol/L   Calcium 9.0 8.7 - 10.3 mg/dL   Total Protein 6.9 6.0 - 8.5 g/dL   Albumin 4.2 3.5 - 4.7 g/dL   Globulin, Total 2.7 1.5 - 4.5 g/dL   Albumin/Globulin Ratio 1.6 1.2 - 2.2   Bilirubin Total 0.3 0.0 - 1.2 mg/dL   Alkaline Phosphatase 66 39 - 117 IU/L   AST 24 0 - 40 IU/L   ALT 14 0 - 32 IU/L      Assessment & Plan:   Problem List Items Addressed This Visit      Unprioritized   RESOLVED: Hypertension    Stable, continue present medications.        Hypertensive chronic kidney disease    Stable, continue present medications.        Relevant Orders   Comprehensive metabolic panel   Hypothyroidism   Relevant Orders   TSH    Other Visit Diagnoses     Need for influenza vaccination    -  Primary   Relevant Orders   Flu vaccine HIGH DOSE PF (Completed)       Follow up plan: Return in about 6 months (around 07/30/2016) for physical.

## 2016-01-31 ENCOUNTER — Encounter: Payer: Self-pay | Admitting: Unknown Physician Specialty

## 2016-01-31 LAB — COMPREHENSIVE METABOLIC PANEL
A/G RATIO: 1.4 (ref 1.2–2.2)
ALBUMIN: 4.2 g/dL (ref 3.5–4.7)
ALT: 11 IU/L (ref 0–32)
AST: 18 IU/L (ref 0–40)
Alkaline Phosphatase: 71 IU/L (ref 39–117)
BUN / CREAT RATIO: 17 (ref 12–28)
BUN: 25 mg/dL (ref 8–27)
Bilirubin Total: 0.3 mg/dL (ref 0.0–1.2)
CALCIUM: 9.1 mg/dL (ref 8.7–10.3)
CO2: 25 mmol/L (ref 18–29)
Chloride: 103 mmol/L (ref 96–106)
Creatinine, Ser: 1.48 mg/dL — ABNORMAL HIGH (ref 0.57–1.00)
GFR, EST AFRICAN AMERICAN: 38 mL/min/{1.73_m2} — AB (ref >59)
GFR, EST NON AFRICAN AMERICAN: 33 mL/min/{1.73_m2} — AB (ref >59)
Globulin, Total: 3.1 g/dL (ref 1.5–4.5)
Glucose: 99 mg/dL (ref 65–99)
Potassium: 5.3 mmol/L — ABNORMAL HIGH (ref 3.5–5.2)
Sodium: 141 mmol/L (ref 134–144)
TOTAL PROTEIN: 7.3 g/dL (ref 6.0–8.5)

## 2016-01-31 LAB — TSH: TSH: 1.85 u[IU]/mL (ref 0.450–4.500)

## 2016-03-01 ENCOUNTER — Other Ambulatory Visit: Payer: Self-pay | Admitting: Unknown Physician Specialty

## 2016-03-26 DIAGNOSIS — J069 Acute upper respiratory infection, unspecified: Secondary | ICD-10-CM | POA: Diagnosis not present

## 2016-03-26 DIAGNOSIS — R0982 Postnasal drip: Secondary | ICD-10-CM | POA: Diagnosis not present

## 2016-04-02 DIAGNOSIS — R3129 Other microscopic hematuria: Secondary | ICD-10-CM | POA: Diagnosis not present

## 2016-04-02 DIAGNOSIS — R3121 Asymptomatic microscopic hematuria: Secondary | ICD-10-CM | POA: Diagnosis not present

## 2016-04-02 DIAGNOSIS — N261 Atrophy of kidney (terminal): Secondary | ICD-10-CM | POA: Diagnosis not present

## 2016-04-02 DIAGNOSIS — I10 Essential (primary) hypertension: Secondary | ICD-10-CM | POA: Diagnosis not present

## 2016-04-02 DIAGNOSIS — N183 Chronic kidney disease, stage 3 (moderate): Secondary | ICD-10-CM | POA: Diagnosis not present

## 2016-04-18 DIAGNOSIS — N261 Atrophy of kidney (terminal): Secondary | ICD-10-CM | POA: Diagnosis not present

## 2016-04-18 DIAGNOSIS — R3129 Other microscopic hematuria: Secondary | ICD-10-CM | POA: Diagnosis not present

## 2016-04-18 DIAGNOSIS — I129 Hypertensive chronic kidney disease with stage 1 through stage 4 chronic kidney disease, or unspecified chronic kidney disease: Secondary | ICD-10-CM | POA: Diagnosis not present

## 2016-04-18 DIAGNOSIS — N183 Chronic kidney disease, stage 3 (moderate): Secondary | ICD-10-CM | POA: Diagnosis not present

## 2016-08-06 ENCOUNTER — Ambulatory Visit (INDEPENDENT_AMBULATORY_CARE_PROVIDER_SITE_OTHER): Payer: PPO | Admitting: Unknown Physician Specialty

## 2016-08-06 ENCOUNTER — Encounter: Payer: Self-pay | Admitting: Unknown Physician Specialty

## 2016-08-06 VITALS — BP 136/76 | HR 80 | Temp 97.7°F | Ht 67.4 in | Wt 147.2 lb

## 2016-08-06 DIAGNOSIS — I129 Hypertensive chronic kidney disease with stage 1 through stage 4 chronic kidney disease, or unspecified chronic kidney disease: Secondary | ICD-10-CM

## 2016-08-06 DIAGNOSIS — M810 Age-related osteoporosis without current pathological fracture: Secondary | ICD-10-CM | POA: Diagnosis not present

## 2016-08-06 DIAGNOSIS — Z0000 Encounter for general adult medical examination without abnormal findings: Secondary | ICD-10-CM

## 2016-08-06 DIAGNOSIS — Z1231 Encounter for screening mammogram for malignant neoplasm of breast: Secondary | ICD-10-CM

## 2016-08-06 DIAGNOSIS — E039 Hypothyroidism, unspecified: Secondary | ICD-10-CM | POA: Diagnosis not present

## 2016-08-06 DIAGNOSIS — Z7189 Other specified counseling: Secondary | ICD-10-CM | POA: Insufficient documentation

## 2016-08-06 MED ORDER — LEVOTHYROXINE SODIUM 100 MCG PO TABS: 100.0000 ug | ORAL_TABLET | Freq: Every day | ORAL | 1 refills | Status: DC

## 2016-08-06 MED ORDER — LISINOPRIL 20 MG PO TABS: 20.0000 mg | ORAL_TABLET | Freq: Every day | ORAL | 1 refills | Status: DC

## 2016-08-06 MED ORDER — RALOXIFENE HCL 60 MG PO TABS: 60.0000 mg | ORAL_TABLET | Freq: Every day | ORAL | 3 refills | Status: DC

## 2016-08-06 NOTE — Assessment & Plan Note (Signed)
Check TSH today

## 2016-08-06 NOTE — Assessment & Plan Note (Signed)
BP is stable.  Also seeing Dr. Candiss Norse for CKD

## 2016-08-06 NOTE — Assessment & Plan Note (Addendum)
A voluntary discussion about advance care planning including the explanation and discussion of advance directives was extensively discussed  with the patient.  Explanation about the health care proxy and Living will was reviewed and packet with forms with explanation of how to fill them out was given.  During this discussion, the patient was able to identify a health care proxy as niece Butch Penny and has the paperwork.  She will bring in forms to be scanned.  She is a full code

## 2016-08-06 NOTE — Progress Notes (Signed)
BP 136/76 (BP Location: Left Arm, Patient Position: Sitting, Cuff Size: Normal)   Pulse 80   Temp 97.7 F (36.5 C)   Ht 5' 7.4" (1.712 m)   Wt 147 lb 3.2 oz (66.8 kg)   LMP  (LMP Unknown)   SpO2 98%   BMI 22.78 kg/m    Subjective:    Patient ID: Sara Cannon, female    DOB: [birth year], 83 y.o.   MRN: [id number]  HPI: Sara Cannon is a 83 y.o. female  Chief Complaint  Patient presents with  . Medicare Wellness   Functional Status Survey: Is the patient deaf or have difficulty hearing?: No Does the patient have difficulty seeing, even when wearing glasses/contacts?: No Does the patient have difficulty concentrating, remembering, or making decisions?: No Does the patient have difficulty walking or climbing stairs?: No Does the patient have difficulty dressing or bathing?: No Does the patient have difficulty doing errands alone such as visiting a doctor's office or shopping?: No  Fall Risk  08/06/2016 01/30/2016 04/21/2015  Falls in the past year? No No No   Depression screen [facility] 2/9 08/06/2016 01/30/2016  Decreased Interest 0 0  Down, Depressed, Hopeless 0 0  PHQ - 2 Score 0 0  Altered sleeping 1 0  Tired, decreased energy 0 0  Change in appetite 0 0  Feeling bad or failure about yourself  0 0  Trouble concentrating 0 0  Moving slowly or fidgety/restless 0 0  Suicidal thoughts 0 0  PHQ-9 Score 1 0   Social History   Social History  . Marital status: Single    Spouse name: N/A  . Number of children: 0  . Years of education: N/A   Occupational History  . retired    Social History Main Topics  . Smoking status: Never Smoker  . Smokeless tobacco: Never Used  . Alcohol use No  . Drug use: No  . Sexual activity: No   Other Topics Concern  . Not on file   Social History Narrative  . No narrative on file   Family History  Problem Relation Age of Onset  . Heart disease Mother   . Congestive Heart Failure Mother   . Hypertension Mother   . Leukemia  Father   . Tremor Brother     x 2, 1 deceased  . Tremor Maternal Grandfather    Past Medical History:  Diagnosis Date  . Anxiety   . 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  . Basal cell carcinoma   . Cancer [facility]) 2007   Descending colon, T3, N0. Adjuvant chemotherapy.  . Depression   . Diverticulitis   . Hypertension   . Hypertensive chronic kidney disease   . Hypothyroidism   . Menopausal state   . Renal failure May 2015   Dr Candiss Norse   Past Surgical History:  Procedure Laterality Date  . CATARACT EXTRACTION Bilateral   . colon resection  2007  . COLONOSCOPY  2008, 2011   Dr Bary Castilla, hyperplastic rectal polyps identified May 2008.  Marland Kitchen COLONOSCOPY WITH PROPOFOL N/A 12/14/2014   Procedure: COLONOSCOPY WITH PROPOFOL;  Surgeon: Robert Bellow, MD;  Location: [facility] ENDOSCOPY;  Service: Endoscopy;  Laterality: N/A;  . PORT-A-CATH REMOVAL  2009  . PORTACATH PLACEMENT  2007   Mini cog is negative  Hypertension Using medications without difficulty Average home BPs Not checking   No problems or lightheadedness No chest pain with exertion or shortness of breath No Edema  Hypothyroid: Feels  good.  Weight has increased but states she is eating too much.    Osteoporosis Taking Raloxifene daily   Relevant past medical, surgical, family and social history reviewed and updated as indicated. Interim medical history since our last visit reviewed. Allergies and medications reviewed and updated.  Review of Systems  Per HPI unless specifically indicated above     Objective:    BP 136/76 (BP Location: Left Arm, Patient Position: Sitting, Cuff Size: Normal)   Pulse 80   Temp 97.7 F (36.5 C)   Ht 5' 7.4" (1.712 m)   Wt 147 lb 3.2 oz (66.8 kg)   LMP  (LMP Unknown)   SpO2 98%   BMI 22.78 kg/m   Wt Readings from Last 3 Encounters:  08/06/16 147 lb 3.2 oz (66.8 kg)  01/30/16 143 lb 3.2 oz (65 kg)  08/10/15 136 lb 2 oz (61.7 kg)    Physical Exam  Constitutional: She is  oriented to person, place, and time. She appears well-developed and well-nourished. No distress.  HENT:  Head: Normocephalic and atraumatic.  Eyes: Conjunctivae and lids are normal. Right eye exhibits no discharge. Left eye exhibits no discharge. No scleral icterus.  Neck: Normal range of motion. Neck supple. No JVD present. Carotid bruit is not present.  Cardiovascular: Normal rate, regular rhythm and normal heart sounds.   Pulmonary/Chest: Effort normal and breath sounds normal.  Abdominal: Normal appearance. There is no splenomegaly or hepatomegaly.  Musculoskeletal: Normal range of motion.  Neurological: She is alert and oriented to person, place, and time.  Skin: Skin is warm, dry and intact. No rash noted. No pallor.  Psychiatric: She has a normal mood and affect. Her behavior is normal. Judgment and thought content normal.    Results for orders placed or performed in visit on 01/30/16  Comprehensive metabolic panel  Result Value Ref Range   Glucose 99 65 - 99 mg/dL   BUN 25 8 - 27 mg/dL   Creatinine, Ser 1.48 (H) 0.57 - 1.00 mg/dL   GFR calc non Af Amer 33 (L) >59 mL/min/1.73   GFR calc Af Amer 38 (L) >59 mL/min/1.73   BUN/Creatinine Ratio 17 12 - 28   Sodium 141 134 - 144 mmol/L   Potassium 5.3 (H) 3.5 - 5.2 mmol/L   Chloride 103 96 - 106 mmol/L   CO2 25 18 - 29 mmol/L   Calcium 9.1 8.7 - 10.3 mg/dL   Total Protein 7.3 6.0 - 8.5 g/dL   Albumin 4.2 3.5 - 4.7 g/dL   Globulin, Total 3.1 1.5 - 4.5 g/dL   Albumin/Globulin Ratio 1.4 1.2 - 2.2   Bilirubin Total 0.3 0.0 - 1.2 mg/dL   Alkaline Phosphatase 71 39 - 117 IU/L   AST 18 0 - 40 IU/L   ALT 11 0 - 32 IU/L  TSH  Result Value Ref Range   TSH 1.850 0.450 - 4.500 uIU/mL      Assessment & Plan:   Problem List Items Addressed This Visit      Unprioritized   Counseling regarding advanced directives and goals of care    A voluntary discussion about advance care planning including the explanation and discussion of advance  directives was extensively discussed  with the patient.  Explanation about the health care proxy and Living will was reviewed and packet with forms with explanation of how to fill them out was given.  During this discussion, the patient was able to identify a health care proxy as niece Butch Penny and  has the paperwork.  She will bring in forms to be scanned.  She is a full code      Hypertensive chronic kidney disease    BP is stable.  Also seeing Dr. Candiss Norse for CKD      Relevant Orders   Comprehensive metabolic panel   CBC with Differential/Platelet   Lipid Panel w/o Chol/HDL Ratio   Hypothyroidism    Check TSH today      Relevant Medications   levothyroxine (SYNTHROID, LEVOTHROID) 100 MCG tablet   Other Relevant Orders   TSH   Osteoporosis    Continue Raloxefene      Relevant Medications   raloxifene (EVISTA) 60 MG tablet    Other Visit Diagnoses    Encounter for screening mammogram for breast cancer    -  Primary   Relevant Orders   MM DIGITAL SCREENING BILATERAL   Annual physical exam           Follow up plan: Return in about 6 months (around 02/05/2017).

## 2016-08-06 NOTE — Assessment & Plan Note (Signed)
Continue Raloxefene

## 2016-08-07 ENCOUNTER — Encounter: Payer: Self-pay | Admitting: Unknown Physician Specialty

## 2016-08-07 LAB — CBC WITH DIFFERENTIAL/PLATELET
BASOS: 0 %
Basophils Absolute: 0 10*3/uL (ref 0.0–0.2)
EOS (ABSOLUTE): 0.1 10*3/uL (ref 0.0–0.4)
Eos: 2 %
Hematocrit: 32.1 % — ABNORMAL LOW (ref 34.0–46.6)
Hemoglobin: 10.7 g/dL — ABNORMAL LOW (ref 11.1–15.9)
IMMATURE GRANS (ABS): 0 10*3/uL (ref 0.0–0.1)
Immature Granulocytes: 0 %
LYMPHS ABS: 2.1 10*3/uL (ref 0.7–3.1)
LYMPHS: 25 %
MCH: 31.1 pg (ref 26.6–33.0)
MCHC: 33.3 g/dL (ref 31.5–35.7)
MCV: 93 fL (ref 79–97)
MONOS ABS: 0.8 10*3/uL (ref 0.1–0.9)
Monocytes: 9 %
NEUTROS ABS: 5.3 10*3/uL (ref 1.4–7.0)
Neutrophils: 64 %
PLATELETS: 194 10*3/uL (ref 150–379)
RBC: 3.44 x10E6/uL — ABNORMAL LOW (ref 3.77–5.28)
RDW: 13.8 % (ref 12.3–15.4)
WBC: 8.4 10*3/uL (ref 3.4–10.8)

## 2016-08-07 LAB — COMPREHENSIVE METABOLIC PANEL
ALT: 18 IU/L (ref 0–32)
AST: 22 IU/L (ref 0–40)
Albumin/Globulin Ratio: 1.4 (ref 1.2–2.2)
Albumin: 4.2 g/dL (ref 3.5–4.7)
Alkaline Phosphatase: 61 IU/L (ref 39–117)
BUN / CREAT RATIO: 19 (ref 12–28)
BUN: 25 mg/dL (ref 8–27)
Bilirubin Total: 0.3 mg/dL (ref 0.0–1.2)
CALCIUM: 9.3 mg/dL (ref 8.7–10.3)
CHLORIDE: 103 mmol/L (ref 96–106)
CO2: 21 mmol/L (ref 18–29)
CREATININE: 1.34 mg/dL — AB (ref 0.57–1.00)
GFR calc Af Amer: 43 mL/min/{1.73_m2} — ABNORMAL LOW (ref >59)
GFR calc non Af Amer: 37 mL/min/{1.73_m2} — ABNORMAL LOW (ref >59)
Globulin, Total: 2.9 g/dL (ref 1.5–4.5)
Glucose: 91 mg/dL (ref 65–99)
Potassium: 5.1 mmol/L (ref 3.5–5.2)
Sodium: 139 mmol/L (ref 134–144)
TOTAL PROTEIN: 7.1 g/dL (ref 6.0–8.5)

## 2016-08-07 LAB — LIPID PANEL W/O CHOL/HDL RATIO
Cholesterol, Total: 186 mg/dL (ref 100–199)
HDL: 57 mg/dL (ref >39)
LDL Calculated: 114 mg/dL — ABNORMAL HIGH (ref 0–99)
Triglycerides: 74 mg/dL (ref 0–149)
VLDL CHOLESTEROL CAL: 15 mg/dL (ref 5–40)

## 2016-08-07 LAB — TSH: TSH: 1.6 u[IU]/mL (ref 0.450–4.500)

## 2016-08-09 ENCOUNTER — Encounter: Payer: Self-pay | Admitting: Emergency Medicine

## 2016-08-09 ENCOUNTER — Emergency Department
Admission: EM | Admit: 2016-08-09 | Discharge: 2016-08-09 | Disposition: A | Discharge status: Home / Self Care | Payer: PPO | Attending: Emergency Medicine | Admitting: Emergency Medicine

## 2016-08-09 ENCOUNTER — Emergency Department: Payer: PPO

## 2016-08-09 DIAGNOSIS — W07XXXA Fall from chair, initial encounter: Secondary | ICD-10-CM | POA: Insufficient documentation

## 2016-08-09 DIAGNOSIS — Y9389 Activity, other specified: Secondary | ICD-10-CM | POA: Diagnosis not present

## 2016-08-09 DIAGNOSIS — I129 Hypertensive chronic kidney disease with stage 1 through stage 4 chronic kidney disease, or unspecified chronic kidney disease: Secondary | ICD-10-CM | POA: Diagnosis not present

## 2016-08-09 DIAGNOSIS — N189 Chronic kidney disease, unspecified: Secondary | ICD-10-CM | POA: Insufficient documentation

## 2016-08-09 DIAGNOSIS — E039 Hypothyroidism, unspecified: Secondary | ICD-10-CM | POA: Insufficient documentation

## 2016-08-09 DIAGNOSIS — Y929 Unspecified place or not applicable: Secondary | ICD-10-CM | POA: Diagnosis not present

## 2016-08-09 DIAGNOSIS — Z7982 Long term (current) use of aspirin: Secondary | ICD-10-CM | POA: Insufficient documentation

## 2016-08-09 DIAGNOSIS — S42291A Other displaced fracture of upper end of right humerus, initial encounter for closed fracture: Secondary | ICD-10-CM | POA: Diagnosis not present

## 2016-08-09 DIAGNOSIS — Y999 Unspecified external cause status: Secondary | ICD-10-CM | POA: Diagnosis not present

## 2016-08-09 DIAGNOSIS — M79601 Pain in right arm: Secondary | ICD-10-CM | POA: Diagnosis not present

## 2016-08-09 DIAGNOSIS — S4991XA Unspecified injury of right shoulder and upper arm, initial encounter: Secondary | ICD-10-CM | POA: Diagnosis not present

## 2016-08-09 DIAGNOSIS — S42211A Unspecified displaced fracture of surgical neck of right humerus, initial encounter for closed fracture: Secondary | ICD-10-CM | POA: Insufficient documentation

## 2016-08-09 MED ORDER — OXYCODONE-ACETAMINOPHEN 5-325 MG PO TABS: 1.0000 | ORAL_TABLET | Freq: Four times a day (QID) | ORAL | 0 refills | Status: DC | PRN

## 2016-08-09 MED ORDER — OXYCODONE-ACETAMINOPHEN 5-325 MG PO TABS
1.0000 | ORAL_TABLET | Freq: Once | ORAL | Status: AC
  Administered 2016-08-09: 1 via ORAL
  Filled 2016-08-09: qty 1

## 2016-08-09 NOTE — ED Provider Notes (Signed)
[facility] Emergency Department Provider Note  ____________________________________________   First MD Initiated Contact with Patient 08/09/16 1121     (approximate)  I have reviewed the triage vital signs and the nursing notes.   HISTORY  Chief Complaint Fall and Arm Pain   HPI Sara Cannon is a 83 y.o. female with a history of hypertension who is presenting after a fall onto her left shoulder. She says that she was trying to hang a curtain and was on a chair when the chair fell over and she fell onto her right proximal humerus. EMS was called and the patient was placed in a sling. She is neurovascularly intact. She denies hitting her head or losing consciousness. Denies any blood thinners beyond aspirin.   Past Medical History:  Diagnosis Date  . Anxiety   . 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  . Basal cell carcinoma   . Cancer [facility]) 2007   Descending colon, T3, N0. Adjuvant chemotherapy.  . Depression   . Diverticulitis   . Hypertension   . Hypertensive chronic kidney disease   . Hypothyroidism   . Menopausal state   . Renal failure May 2015   Dr Candiss Norse    Patient Active Problem List   Diagnosis Date Noted  . Counseling regarding advanced directives and goals of care 08/06/2016  . Osteoporosis 08/06/2016  . Insomnia 07/31/2015  . Basal cell carcinoma   . Menopausal state   . Diverticulitis   . Depression   . Anxiety   . Hypertensive chronic kidney disease   . Hypothyroidism   . History of colon cancer 10/26/2014    Past Surgical History:  Procedure Laterality Date  . CATARACT EXTRACTION Bilateral   . colon resection  2007  . COLONOSCOPY  2008, 2011   Dr Bary Castilla, hyperplastic rectal polyps identified May 2008.  Marland Kitchen COLONOSCOPY WITH PROPOFOL N/A 12/14/2014   Procedure: COLONOSCOPY WITH PROPOFOL;  Surgeon: Robert Bellow, MD;  Location: [facility] ENDOSCOPY;  Service: Endoscopy;  Laterality: N/A;  . PORT-A-CATH REMOVAL  2009    . PORTACATH PLACEMENT  2007    Prior to Admission medications   Medication Sig Start Date End Date Taking? Authorizing Provider  aspirin 81 MG tablet Take 81 mg by mouth daily.    Historical Provider, MD  levothyroxine (SYNTHROID, LEVOTHROID) 100 MCG tablet Take 1 tablet (100 mcg total) by mouth daily. 08/06/16   [name], NP  lisinopril (PRINIVIL,ZESTRIL) 20 MG tablet Take 1 tablet (20 mg total) by mouth daily. 08/06/16   [name], NP  Melatonin 5 MG TABS Take 1 tablet by mouth daily.    Historical Provider, MD  raloxifene (EVISTA) 60 MG tablet Take 1 tablet (60 mg total) by mouth daily. 08/06/16   [name], NP    Allergies Patient has no known allergies.  Family History  Problem Relation Age of Onset  . Heart disease Mother   . Congestive Heart Failure Mother   . Hypertension Mother   . Leukemia Father   . Tremor Brother     x 2, 1 deceased  . Tremor Maternal Grandfather     Social History Social History  Substance Use Topics  . Smoking status: Never Smoker  . Smokeless tobacco: Never Used  . Alcohol use No    Review of Systems Constitutional: No fever/chills Eyes: No visual changes. ENT: No sore throat. Cardiovascular: Denies chest pain. Respiratory: Denies shortness of breath. Gastrointestinal: No abdominal pain.  No nausea, no vomiting.  No diarrhea.  No constipation. Genitourinary: Negative for dysuria. Musculoskeletal: Negative for back pain. Skin: Negative for rash. Neurological: Negative for headaches, focal weakness or numbness.  10-point ROS otherwise negative.  ____________________________________________   PHYSICAL EXAM:  VITAL SIGNS: ED Triage Vitals  Enc Vitals Group     BP 08/09/16 1120 (!) 142/72     Pulse Rate 08/09/16 1120 63     Resp 08/09/16 1120 16     Temp 08/09/16 1120 97.7 F (36.5 C)     Temp Source 08/09/16 1120 Oral     SpO2 08/09/16 1120 100 %     Weight 08/09/16 1122 147 lb (66.7 kg)     Height 08/09/16 1122  5\' 7"  (1.702 m)     Head Circumference --      Peak Flow --      Pain Score 08/09/16 1119 7     Pain Loc --      Pain Edu? --      Excl. in [place]? --     Constitutional: Alert and oriented. Well appearing and in no acute distress. Eyes: Conjunctivae are normal. PERRL. EOMI. Head: Atraumatic. Nose: No congestion/rhinnorhea. Mouth/Throat: Mucous membranes are moist.   Neck: No stridor.   Cardiovascular: Normal rate, regular rhythm. Grossly normal heart sounds.  Good peripheral circulation. Respiratory: Normal respiratory effort.  No retractions. Lungs CTAB. Gastrointestinal: Soft and nontender. No distention. Musculoskeletal: No lower extremity tenderness nor edema.  No joint effusions.  Right upper extremity in a sling. Tender to palpation of the posterior, proximal humerus. No obvious deformity. Distally, the patient has intact radial pulses. 5 out of 5 grip strength bilaterally with intact sensation to light touch bilaterally.  Neurologic:  Normal speech and language. No gross focal neurologic deficits are appreciated. No gait instability. Skin:  Skin is warm, dry and intact. No rash noted. Psychiatric: Mood and affect are normal. Speech and behavior are normal.  ____________________________________________   LABS (all labs ordered are listed, but only abnormal results are displayed)  Labs Reviewed - No data to display ____________________________________________  EKG   ____________________________________________  RADIOLOGY  DG Humerus Right (Final result)  Result time 08/09/16 11:56:03  Final result by Gus Height, MD (08/09/16 11:56:03)           Narrative:   CLINICAL DATA: Pt fell off chair trying to hang the curtain this am. Pain posteriorly proximal humerus area-pain radiates down arm.  EXAM: RIGHT HUMERUS - 2+ VIEW  COMPARISON: None.  FINDINGS: Fracture through the proximal humerus at the surgical neck. Mild impaction. The glenohumeral joint is  intact.  IMPRESSION: Fracture of the proximal humerus at the surgical neck.   Electronically Signed By: Suzy Bouchard M.D. On: 08/09/2016 11:56            ____________________________________________   PROCEDURES  Procedure(s) performed:   Procedures  Critical Care performed:   ____________________________________________   INITIAL IMPRESSION / ASSESSMENT AND PLAN / ED COURSE  Pertinent labs & imaging results that were available during my care of the patient were reviewed by me and considered in my medical decision making (see chart for details).  ----------------------------------------- 12:38 PM on 08/09/2016 -----------------------------------------  Case was discussed with Dr. Sabra Heck of orthopedics who recommends putting the patient a shoulder immobilizer and having her follow-up in the office in 5 days. He also recommends ice that the patient not fully reclined. I discussed this plan with the patient is understanding and willing to comply. She says she lives at home alone but  is fully functional and thinks she will be to manage just fine. She is understanding of this plan and willing to comply. She is also accompanied by relatives, her nephew, in the room.      ____________________________________________   FINAL CLINICAL IMPRESSION(S) / ED DIAGNOSES  Fall. Proximal humerus fracture.    NEW MEDICATIONS STARTED DURING THIS VISIT:  New Prescriptions   No medications on file     Note:  This document was prepared using Dragon voice recognition software and may include unintentional dictation errors.    Orbie Pyo, MD 08/09/16 1239

## 2016-08-09 NOTE — ED Triage Notes (Signed)
Pt to ED via EMS from home after fall today.  Patient standing on chair fixing curtains when she miss stepped and fell onto right arm and side.  Denies hitting head or LOC, denies blood thinners.  Presents A&Ox4, chest rise even and unlabored.

## 2016-08-12 DIAGNOSIS — S42231A 3-part fracture of surgical neck of right humerus, initial encounter for closed fracture: Secondary | ICD-10-CM | POA: Diagnosis not present

## 2016-08-26 DIAGNOSIS — S42231A 3-part fracture of surgical neck of right humerus, initial encounter for closed fracture: Secondary | ICD-10-CM | POA: Diagnosis not present

## 2016-08-29 ENCOUNTER — Other Ambulatory Visit: Payer: Self-pay | Admitting: Unknown Physician Specialty

## 2016-09-16 DIAGNOSIS — S42231A 3-part fracture of surgical neck of right humerus, initial encounter for closed fracture: Secondary | ICD-10-CM | POA: Diagnosis not present

## 2016-09-17 DIAGNOSIS — M25511 Pain in right shoulder: Secondary | ICD-10-CM | POA: Diagnosis not present

## 2016-09-17 DIAGNOSIS — M25611 Stiffness of right shoulder, not elsewhere classified: Secondary | ICD-10-CM | POA: Diagnosis not present

## 2016-09-20 DIAGNOSIS — M25611 Stiffness of right shoulder, not elsewhere classified: Secondary | ICD-10-CM | POA: Diagnosis not present

## 2016-09-20 DIAGNOSIS — M25511 Pain in right shoulder: Secondary | ICD-10-CM | POA: Diagnosis not present

## 2016-09-24 DIAGNOSIS — M25611 Stiffness of right shoulder, not elsewhere classified: Secondary | ICD-10-CM | POA: Diagnosis not present

## 2016-09-24 DIAGNOSIS — M25511 Pain in right shoulder: Secondary | ICD-10-CM | POA: Diagnosis not present

## 2016-09-26 DIAGNOSIS — M25611 Stiffness of right shoulder, not elsewhere classified: Secondary | ICD-10-CM | POA: Diagnosis not present

## 2016-09-26 DIAGNOSIS — M25511 Pain in right shoulder: Secondary | ICD-10-CM | POA: Diagnosis not present

## 2016-09-30 DIAGNOSIS — M25511 Pain in right shoulder: Secondary | ICD-10-CM | POA: Diagnosis not present

## 2016-09-30 DIAGNOSIS — M25611 Stiffness of right shoulder, not elsewhere classified: Secondary | ICD-10-CM | POA: Diagnosis not present

## 2016-10-03 DIAGNOSIS — M25611 Stiffness of right shoulder, not elsewhere classified: Secondary | ICD-10-CM | POA: Diagnosis not present

## 2016-10-03 DIAGNOSIS — M25511 Pain in right shoulder: Secondary | ICD-10-CM | POA: Diagnosis not present

## 2016-10-07 DIAGNOSIS — M25611 Stiffness of right shoulder, not elsewhere classified: Secondary | ICD-10-CM | POA: Diagnosis not present

## 2016-10-07 DIAGNOSIS — M25511 Pain in right shoulder: Secondary | ICD-10-CM | POA: Diagnosis not present

## 2016-10-08 DIAGNOSIS — R3129 Other microscopic hematuria: Secondary | ICD-10-CM | POA: Diagnosis not present

## 2016-10-08 DIAGNOSIS — I10 Essential (primary) hypertension: Secondary | ICD-10-CM | POA: Diagnosis not present

## 2016-10-08 DIAGNOSIS — N183 Chronic kidney disease, stage 3 (moderate): Secondary | ICD-10-CM | POA: Diagnosis not present

## 2016-10-08 DIAGNOSIS — N261 Atrophy of kidney (terminal): Secondary | ICD-10-CM | POA: Diagnosis not present

## 2016-10-09 DIAGNOSIS — M25511 Pain in right shoulder: Secondary | ICD-10-CM | POA: Diagnosis not present

## 2016-10-10 DIAGNOSIS — M25511 Pain in right shoulder: Secondary | ICD-10-CM | POA: Diagnosis not present

## 2016-10-10 DIAGNOSIS — M25611 Stiffness of right shoulder, not elsewhere classified: Secondary | ICD-10-CM | POA: Diagnosis not present

## 2016-10-12 ENCOUNTER — Other Ambulatory Visit: Payer: Self-pay | Admitting: Unknown Physician Specialty

## 2016-10-15 DIAGNOSIS — M25611 Stiffness of right shoulder, not elsewhere classified: Secondary | ICD-10-CM | POA: Diagnosis not present

## 2016-10-15 DIAGNOSIS — M25511 Pain in right shoulder: Secondary | ICD-10-CM | POA: Diagnosis not present

## 2016-10-17 DIAGNOSIS — M25611 Stiffness of right shoulder, not elsewhere classified: Secondary | ICD-10-CM | POA: Diagnosis not present

## 2016-10-17 DIAGNOSIS — M25511 Pain in right shoulder: Secondary | ICD-10-CM | POA: Diagnosis not present

## 2016-10-20 DIAGNOSIS — J988 Other specified respiratory disorders: Secondary | ICD-10-CM | POA: Diagnosis not present

## 2016-10-24 DIAGNOSIS — M25611 Stiffness of right shoulder, not elsewhere classified: Secondary | ICD-10-CM | POA: Diagnosis not present

## 2016-10-24 DIAGNOSIS — M25511 Pain in right shoulder: Secondary | ICD-10-CM | POA: Diagnosis not present

## 2016-12-20 DIAGNOSIS — Z1231 Encounter for screening mammogram for malignant neoplasm of breast: Secondary | ICD-10-CM | POA: Diagnosis not present

## 2016-12-23 ENCOUNTER — Encounter: Payer: Self-pay | Admitting: Unknown Physician Specialty

## 2017-02-11 ENCOUNTER — Ambulatory Visit: Payer: PPO | Admitting: Unknown Physician Specialty

## 2017-02-21 ENCOUNTER — Ambulatory Visit (INDEPENDENT_AMBULATORY_CARE_PROVIDER_SITE_OTHER): Payer: PPO | Admitting: Unknown Physician Specialty

## 2017-02-21 ENCOUNTER — Encounter: Payer: Self-pay | Admitting: Unknown Physician Specialty

## 2017-02-21 VITALS — BP 109/64 | HR 60 | Temp 97.9°F | Wt 141.4 lb

## 2017-02-21 DIAGNOSIS — Z23 Encounter for immunization: Secondary | ICD-10-CM

## 2017-02-21 DIAGNOSIS — E039 Hypothyroidism, unspecified: Secondary | ICD-10-CM

## 2017-02-21 DIAGNOSIS — N183 Chronic kidney disease, stage 3 (moderate): Secondary | ICD-10-CM

## 2017-02-21 DIAGNOSIS — I129 Hypertensive chronic kidney disease with stage 1 through stage 4 chronic kidney disease, or unspecified chronic kidney disease: Secondary | ICD-10-CM | POA: Diagnosis not present

## 2017-02-21 DIAGNOSIS — L659 Nonscarring hair loss, unspecified: Secondary | ICD-10-CM | POA: Diagnosis not present

## 2017-02-21 DIAGNOSIS — R252 Cramp and spasm: Secondary | ICD-10-CM

## 2017-02-21 DIAGNOSIS — K582 Mixed irritable bowel syndrome: Secondary | ICD-10-CM | POA: Diagnosis not present

## 2017-02-21 DIAGNOSIS — K589 Irritable bowel syndrome without diarrhea: Secondary | ICD-10-CM | POA: Insufficient documentation

## 2017-02-21 NOTE — Assessment & Plan Note (Signed)
Discussed Magnesium supplements

## 2017-02-21 NOTE — Assessment & Plan Note (Signed)
Check thyroid panel 

## 2017-02-21 NOTE — Progress Notes (Signed)
BP 109/64   Pulse 60   Temp 97.9 F (36.6 C)   Wt 141 lb 6.4 oz (64.1 kg)   LMP  (LMP Unknown)   SpO2 100%   BMI 22.15 kg/m    Subjective:    Patient ID: Sara Cannon, female    DOB: 1932/11/08, 83 y.o.   MRN: [id number]  HPI: Sara Cannon is a 83 y.o. female  Chief Complaint  Patient presents with  . Hypertension  . Hypothyroidism  . Alopecia    pt states that her hair has been falling out, thinks it may be vitamin D or thyroid related   Hypertension Using medications without difficulty Average home BPs Not checking   No problems or lightheadedness No chest pain with exertion or shortness of breath No Edema  Hypothyroid Noting hair falling out.  This is going on for about 3 months.  Did break her arm 6 months ago.    Cramps Taking CaMg which helps but tears up her stomach with  Diarrhea.  However seems to struggle with IBS with alternating diarrhea and constipations  Relevant past medical, surgical, family and social history reviewed and updated as indicated. Interim medical history since our last visit reviewed. Allergies and medications reviewed and updated.  Review of Systems  Per HPI unless specifically indicated above     Objective:    BP 109/64   Pulse 60   Temp 97.9 F (36.6 C)   Wt 141 lb 6.4 oz (64.1 kg)   LMP  (LMP Unknown)   SpO2 100%   BMI 22.15 kg/m   Wt Readings from Last 3 Encounters:  02/21/17 141 lb 6.4 oz (64.1 kg)  08/09/16 147 lb (66.7 kg)  08/06/16 147 lb 3.2 oz (66.8 kg)    Physical Exam  Constitutional: She is oriented to person, place, and time. She appears well-developed and well-nourished. No distress.  HENT:  Head: Normocephalic and atraumatic.  Eyes: Conjunctivae and lids are normal. Right eye exhibits no discharge. Left eye exhibits no discharge. No scleral icterus.  Neck: Normal range of motion. Neck supple. No JVD present. Carotid bruit is not present.  Cardiovascular: Normal rate, regular rhythm and normal  heart sounds.   Pulmonary/Chest: Effort normal and breath sounds normal.  Abdominal: Normal appearance. There is no splenomegaly or hepatomegaly.  Musculoskeletal: Normal range of motion.  Neurological: She is alert and oriented to person, place, and time.  Skin: Skin is warm, dry and intact. No rash noted. No pallor.  Psychiatric: She has a normal mood and affect. Her behavior is normal. Judgment and thought content normal.    Results for orders placed or performed in visit on 08/06/16  Comprehensive metabolic panel  Result Value Ref Range   Glucose 91 65 - 99 mg/dL   BUN 25 8 - 27 mg/dL   Creatinine, Ser 1.34 (H) 0.57 - 1.00 mg/dL   GFR calc non Af Amer 37 (L) >59 mL/min/1.73   GFR calc Af Amer 43 (L) >59 mL/min/1.73   BUN/Creatinine Ratio 19 12 - 28   Sodium 139 134 - 144 mmol/L   Potassium 5.1 3.5 - 5.2 mmol/L   Chloride 103 96 - 106 mmol/L   CO2 21 18 - 29 mmol/L   Calcium 9.3 8.7 - 10.3 mg/dL   Total Protein 7.1 6.0 - 8.5 g/dL   Albumin 4.2 3.5 - 4.7 g/dL   Globulin, Total 2.9 1.5 - 4.5 g/dL   Albumin/Globulin Ratio 1.4 1.2 - 2.2  Bilirubin Total 0.3 0.0 - 1.2 mg/dL   Alkaline Phosphatase 61 39 - 117 IU/L   AST 22 0 - 40 IU/L   ALT 18 0 - 32 IU/L  CBC with Differential/Platelet  Result Value Ref Range   WBC 8.4 3.4 - 10.8 x10E3/uL   RBC 3.44 (L) 3.77 - 5.28 x10E6/uL   Hemoglobin 10.7 (L) 11.1 - 15.9 g/dL   Hematocrit 32.1 (L) 34.0 - 46.6 %   MCV 93 79 - 97 fL   MCH 31.1 26.6 - 33.0 pg   MCHC 33.3 31.5 - 35.7 g/dL   RDW 13.8 12.3 - 15.4 %   Platelets 194 150 - 379 x10E3/uL   Neutrophils 64 Not Estab. %   Lymphs 25 Not Estab. %   Monocytes 9 Not Estab. %   Eos 2 Not Estab. %   Basos 0 Not Estab. %   Neutrophils Absolute 5.3 1.4 - 7.0 x10E3/uL   Lymphocytes Absolute 2.1 0.7 - 3.1 x10E3/uL   Monocytes Absolute 0.8 0.1 - 0.9 x10E3/uL   EOS (ABSOLUTE) 0.1 0.0 - 0.4 x10E3/uL   Basophils Absolute 0.0 0.0 - 0.2 x10E3/uL   Immature Granulocytes 0 Not Estab. %    Immature Grans (Abs) 0.0 0.0 - 0.1 x10E3/uL  Lipid Panel w/o Chol/HDL Ratio  Result Value Ref Range   Cholesterol, Total 186 100 - 199 mg/dL   Triglycerides 74 0 - 149 mg/dL   HDL 57 >39 mg/dL   VLDL Cholesterol Cal 15 5 - 40 mg/dL   LDL Calculated 114 (H) 0 - 99 mg/dL  TSH  Result Value Ref Range   TSH 1.600 0.450 - 4.500 uIU/mL      Assessment & Plan:   Problem List Items Addressed This Visit      Unprioritized   Alopecia    Check thyroid panel      Relevant Orders   Thyroid Panel With TSH   VITAMIN D 25 Hydroxy (Vit-D Deficiency, Fractures)   PTH, Intact and Calcium   Cramps of lower extremity    Discussed Magnesium supplements      Hypertensive chronic kidney disease    Stable, continue present medications.        Relevant Orders   Comprehensive metabolic panel   VITAMIN D 25 Hydroxy (Vit-D Deficiency, Fractures)   PTH, Intact and Calcium   Hypothyroidism    Check thyroid panel      Relevant Orders   Thyroid Panel With TSH    Other Visit Diagnoses    Need for influenza vaccination    -  Primary   Relevant Orders   Flu vaccine HIGH DOSE PF (Completed)       Follow up plan: Return in about 6 months (around 08/22/2017).

## 2017-02-21 NOTE — Patient Instructions (Addendum)
Influenza (Flu) Vaccine (Inactivated or Recombinant): What You Need to Know 1. Why get vaccinated? Influenza ("flu") is a contagious disease that spreads around the [place] every year, usually between October and May. Flu is caused by influenza viruses, and is spread mainly by coughing, sneezing, and close contact. Anyone can get flu. Flu strikes suddenly and can last several days. Symptoms vary by age, but can include:  fever/chills  sore throat  muscle aches  fatigue  cough  headache  runny or stuffy nose  Flu can also lead to pneumonia and blood infections, and cause diarrhea and seizures in children. If you have a medical condition, such as heart or lung disease, flu can make it worse. Flu is more dangerous for some people. Infants and young children, people 23 years of age and older, pregnant women, and people with certain health conditions or a weakened immune system are at greatest risk. Each year thousands of people in the [place] die from flu, and many more are hospitalized. Flu vaccine can:  keep you from getting flu,  make flu less severe if you do get it, and  keep you from spreading flu to your family and other people. 2. Inactivated and recombinant flu vaccines A dose of flu vaccine is recommended every flu season. Children 6 months through 91 years of age may need two doses during the same flu season. Everyone else needs only one dose each flu season. Some inactivated flu vaccines contain a very small amount of a mercury-based preservative called thimerosal. Studies have not shown thimerosal in vaccines to be harmful, but flu vaccines that do not contain thimerosal are available. There is no live flu virus in flu shots. They cannot cause the flu. There are many flu viruses, and they are always changing. Each year a new flu vaccine is made to protect against three or four viruses that are likely to cause disease in the upcoming flu season. But even when the  vaccine doesn't exactly match these viruses, it may still provide some protection. Flu vaccine cannot prevent:  flu that is caused by a virus not covered by the vaccine, or  illnesses that look like flu but are not.  It takes about 2 weeks for protection to develop after vaccination, and protection lasts through the flu season. 3. Some people should not get this vaccine Tell the person who is giving you the vaccine:  If you have any severe, life-threatening allergies. If you ever had a life-threatening allergic reaction after a dose of flu vaccine, or have a severe allergy to any part of this vaccine, you may be advised not to get vaccinated. Most, but not all, types of flu vaccine contain a small amount of egg protein.  If you ever had Guillain-Barr Syndrome (also called GBS). Some people with a history of GBS should not get this vaccine. This should be discussed with your doctor.  If you are not feeling well. It is usually okay to get flu vaccine when you have a mild illness, but you might be asked to come back when you feel better.  4. Risks of a vaccine reaction With any medicine, including vaccines, there is a chance of reactions. These are usually mild and go away on their own, but serious reactions are also possible. Most people who get a flu shot do not have any problems with it. Minor problems following a flu shot include:  soreness, redness, or swelling where the shot was given  hoarseness  sore,  red or itchy eyes  cough  fever  aches  headache  itching  fatigue  If these problems occur, they usually begin soon after the shot and last 1 or 2 days. More serious problems following a flu shot can include the following:  There may be a small increased risk of Guillain-Barre Syndrome (GBS) after inactivated flu vaccine. This risk has been estimated at 1 or 2 additional cases per million people vaccinated. This is much lower than the risk of severe complications from  flu, which can be prevented by flu vaccine.  Young children who get the flu shot along with pneumococcal vaccine (PCV13) and/or DTaP vaccine at the same time might be slightly more likely to have a seizure caused by fever. Ask your doctor for more information. Tell your doctor if a child who is getting flu vaccine has ever had a seizure.  Problems that could happen after any injected vaccine:  People sometimes faint after a medical procedure, including vaccination. Sitting or lying down for about 15 minutes can help prevent fainting, and injuries caused by a fall. Tell your doctor if you feel dizzy, or have vision changes or ringing in the ears.  Some people get severe pain in the shoulder and have difficulty moving the arm where a shot was given. This happens very rarely.  Any medication can cause a severe allergic reaction. Such reactions from a vaccine are very rare, estimated at about 1 in a million doses, and would happen within a few minutes to a few hours after the vaccination. As with any medicine, there is a very remote chance of a vaccine causing a serious injury or death. The safety of vaccines is always being monitored. For more information, visit: http://www.aguilar.org/ 5. What if there is a serious reaction? What should I look for? Look for anything that concerns you, such as signs of a severe allergic reaction, very high fever, or unusual behavior. Signs of a severe allergic reaction can include hives, swelling of the face and throat, difficulty breathing, a fast heartbeat, dizziness, and weakness. These would start a few minutes to a few hours after the vaccination. What should I do?  If you think it is a severe allergic reaction or other emergency that can't wait, call 9-1-1 and get the person to the nearest hospital. Otherwise, call your doctor.  Reactions should be reported to the Vaccine Adverse Event Reporting System (VAERS). Your doctor should file this report, or you  can do it yourself through the VAERS web site at www.vaers.SamedayNews.es, or by calling 6094730752. ? VAERS does not give medical advice. 6. The National Vaccine Injury Compensation Program The [employer] Vaccine Injury Compensation Program (VICP) is a federal program that was created to compensate people who may have been injured by certain vaccines. Persons who believe they may have been injured by a vaccine can learn about the program and about filing a claim by calling 458-267-6070 or visiting the [place] website at GoldCloset.com.ee. There is a time limit to file a claim for compensation. 7. How can I learn more?  Ask your healthcare provider. He or she can give you the vaccine package insert or suggest other sources of information.  Call your local or state health department.  Contact the Centers for Disease Control and Prevention (CDC): ? Call (540)164-9661 (1-800-CDC-INFO) or ? Visit CDC's website at https://gibson.com/ Vaccine Information Statement, Inactivated Influenza Vaccine (12/03/2013) This information is not intended to replace advice given to you by your health care provider. Make sure  you discuss any questions you have with your health care provider. ------------------------------------------------------------ Take Magnesium Citrate, Glycinate, or Malate.

## 2017-02-21 NOTE — Assessment & Plan Note (Signed)
Stable, continue present medications.   

## 2017-02-22 LAB — COMPREHENSIVE METABOLIC PANEL
ALBUMIN: 4 g/dL (ref 3.5–4.7)
ALT: 11 IU/L (ref 0–32)
AST: 23 IU/L (ref 0–40)
Albumin/Globulin Ratio: 1.4 (ref 1.2–2.2)
Alkaline Phosphatase: 68 IU/L (ref 39–117)
BILIRUBIN TOTAL: 0.3 mg/dL (ref 0.0–1.2)
BUN / CREAT RATIO: 16 (ref 12–28)
BUN: 28 mg/dL — AB (ref 8–27)
CO2: 21 mmol/L (ref 20–29)
CREATININE: 1.73 mg/dL — AB (ref 0.57–1.00)
Calcium: 9.2 mg/dL (ref 8.7–10.3)
Chloride: 105 mmol/L (ref 96–106)
GFR calc non Af Amer: 27 mL/min/{1.73_m2} — ABNORMAL LOW (ref >59)
GFR, EST AFRICAN AMERICAN: 31 mL/min/{1.73_m2} — AB (ref >59)
GLOBULIN, TOTAL: 2.9 g/dL (ref 1.5–4.5)
GLUCOSE: 104 mg/dL — AB (ref 65–99)
Potassium: 5.1 mmol/L (ref 3.5–5.2)
SODIUM: 141 mmol/L (ref 134–144)
TOTAL PROTEIN: 6.9 g/dL (ref 6.0–8.5)

## 2017-02-22 LAB — PTH, INTACT AND CALCIUM: PTH: 63 pg/mL (ref 15–65)

## 2017-02-22 LAB — THYROID PANEL WITH TSH
Free Thyroxine Index: 2.6 (ref 1.2–4.9)
T3 Uptake Ratio: 28 % (ref 24–39)
T4 TOTAL: 9.4 ug/dL (ref 4.5–12.0)
TSH: 1.14 u[IU]/mL (ref 0.450–4.500)

## 2017-02-22 LAB — VITAMIN D 25 HYDROXY (VIT D DEFICIENCY, FRACTURES): Vit D, 25-Hydroxy: 20.5 ng/mL — ABNORMAL LOW (ref 30.0–100.0)

## 2017-04-18 ENCOUNTER — Other Ambulatory Visit: Payer: Self-pay | Admitting: Unknown Physician Specialty

## 2017-05-29 DIAGNOSIS — R3121 Asymptomatic microscopic hematuria: Secondary | ICD-10-CM | POA: Diagnosis not present

## 2017-05-29 DIAGNOSIS — N261 Atrophy of kidney (terminal): Secondary | ICD-10-CM | POA: Diagnosis not present

## 2017-05-29 DIAGNOSIS — I10 Essential (primary) hypertension: Secondary | ICD-10-CM | POA: Diagnosis not present

## 2017-05-29 DIAGNOSIS — R3129 Other microscopic hematuria: Secondary | ICD-10-CM | POA: Diagnosis not present

## 2017-05-29 DIAGNOSIS — N183 Chronic kidney disease, stage 3 (moderate): Secondary | ICD-10-CM | POA: Diagnosis not present

## 2017-06-12 DIAGNOSIS — N183 Chronic kidney disease, stage 3 (moderate): Secondary | ICD-10-CM | POA: Diagnosis not present

## 2017-06-12 DIAGNOSIS — R3129 Other microscopic hematuria: Secondary | ICD-10-CM | POA: Diagnosis not present

## 2017-06-12 DIAGNOSIS — N261 Atrophy of kidney (terminal): Secondary | ICD-10-CM | POA: Diagnosis not present

## 2017-06-12 DIAGNOSIS — I10 Essential (primary) hypertension: Secondary | ICD-10-CM | POA: Diagnosis not present

## 2017-07-23 ENCOUNTER — Other Ambulatory Visit: Payer: Self-pay | Admitting: Unknown Physician Specialty

## 2017-08-22 ENCOUNTER — Encounter: Payer: Self-pay | Admitting: Unknown Physician Specialty

## 2017-08-22 ENCOUNTER — Ambulatory Visit (INDEPENDENT_AMBULATORY_CARE_PROVIDER_SITE_OTHER): Payer: PPO | Admitting: Unknown Physician Specialty

## 2017-08-22 ENCOUNTER — Ambulatory Visit (INDEPENDENT_AMBULATORY_CARE_PROVIDER_SITE_OTHER): Payer: PPO

## 2017-08-22 VITALS — BP 137/75 | HR 62 | Temp 98.2°F | Resp 17 | Ht 64.8 in | Wt 146.3 lb

## 2017-08-22 VITALS — BP 137/75 | HR 62 | Temp 98.2°F | Ht 64.8 in | Wt 146.3 lb

## 2017-08-22 DIAGNOSIS — N183 Chronic kidney disease, stage 3 (moderate): Secondary | ICD-10-CM | POA: Diagnosis not present

## 2017-08-22 DIAGNOSIS — Z0000 Encounter for general adult medical examination without abnormal findings: Secondary | ICD-10-CM | POA: Diagnosis not present

## 2017-08-22 DIAGNOSIS — M810 Age-related osteoporosis without current pathological fracture: Secondary | ICD-10-CM | POA: Diagnosis not present

## 2017-08-22 DIAGNOSIS — E875 Hyperkalemia: Secondary | ICD-10-CM

## 2017-08-22 DIAGNOSIS — E039 Hypothyroidism, unspecified: Secondary | ICD-10-CM | POA: Diagnosis not present

## 2017-08-22 DIAGNOSIS — I129 Hypertensive chronic kidney disease with stage 1 through stage 4 chronic kidney disease, or unspecified chronic kidney disease: Secondary | ICD-10-CM

## 2017-08-22 DIAGNOSIS — Z23 Encounter for immunization: Secondary | ICD-10-CM

## 2017-08-22 MED ORDER — LISINOPRIL 10 MG PO TABS: 10.0000 mg | ORAL_TABLET | Freq: Every day | ORAL | 3 refills | Status: DC

## 2017-08-22 MED ORDER — RALOXIFENE HCL 60 MG PO TABS: 60.0000 mg | ORAL_TABLET | Freq: Every day | ORAL | 3 refills | Status: DC

## 2017-08-22 MED ORDER — LEVOTHYROXINE SODIUM 100 MCG PO TABS: 100.0000 ug | ORAL_TABLET | Freq: Every day | ORAL | 3 refills | Status: DC

## 2017-08-22 NOTE — Patient Instructions (Addendum)
Sara Cannon , Thank you for taking time to come for your Medicare Wellness Visit. I appreciate your ongoing commitment to your health goals. Please review the following plan we discussed and let me know if I can assist you in the future.   Screening recommendations/referrals: Colonoscopy: no longer required Mammogram: no longer required Bone Density: no longer required Recommended yearly ophthalmology/optometry visit for glaucoma screening and checkup Recommended yearly dental visit for hygiene and checkup  Vaccinations: Influenza vaccine: up to date Pneumococcal vaccine: completed series Tdap vaccine: due, check with your insurance company for coverage Shingles vaccine: not required  Advanced directives: Please bring a copy of your health care power of attorney and living will to the office at your convenience.  Conditions/risks identified: recommend drinking at least 6-8 glasses of water a day   Next appointment: Follow up in one year for your annual wellness exam.    Preventive Care 65 Years and Older, Female Preventive care refers to lifestyle choices and visits with your health care provider that can promote health and wellness. What does preventive care include?  A yearly physical exam. This is also called an annual well check.  Dental exams once or twice a year.  Routine eye exams. Ask your health care provider how often you should have your eyes checked.  Personal lifestyle choices, including:  Daily care of your teeth and gums.  Regular physical activity.  Eating a healthy diet.  Avoiding tobacco and drug use.  Limiting alcohol use.  Practicing safe sex.  Taking low-dose aspirin every day.  Taking vitamin and mineral supplements as recommended by your health care provider. What happens during an annual well check? The services and screenings done by your health care provider during your annual well check will depend on your age, overall health, lifestyle  risk factors, and family history of disease. Counseling  Your health care provider may ask you questions about your:  Alcohol use.  Tobacco use.  Drug use.  Emotional well-being.  Home and relationship well-being.  Sexual activity.  Eating habits.  History of falls.  Memory and ability to understand (cognition).  Work and work Statistician.  Reproductive health. Screening  You may have the following tests or measurements:  Height, weight, and BMI.  Blood pressure.  Lipid and cholesterol levels. These may be checked every 5 years, or more frequently if you are over 62 years old.  Skin check.  Lung cancer screening. You may have this screening every year starting at age 33 if you have a 30-pack-year history of smoking and currently smoke or have quit within the past 15 years.  Fecal occult blood test (FOBT) of the stool. You may have this test every year starting at age 69.  Flexible sigmoidoscopy or colonoscopy. You may have a sigmoidoscopy every 5 years or a colonoscopy every 10 years starting at age 56.  Hepatitis C blood test.  Hepatitis B blood test.  Sexually transmitted disease (STD) testing.  Diabetes screening. This is done by checking your blood sugar (glucose) after you have not eaten for a while (fasting). You may have this done every 1-3 years.  Bone density scan. This is done to screen for osteoporosis. You may have this done starting at age 61.  Mammogram. This may be done every 1-2 years. Talk to your health care provider about how often you should have regular mammograms. Talk with your health care provider about your test results, treatment options, and if necessary, the need for more tests. Vaccines  Your health care provider may recommend certain vaccines, such as:  Influenza vaccine. This is recommended every year.  Tetanus, diphtheria, and acellular pertussis (Tdap, Td) vaccine. You may need a Td booster every 10 years.  Zoster vaccine.  You may need this after age 24.  Pneumococcal 13-valent conjugate (PCV13) vaccine. One dose is recommended after age 26.  Pneumococcal polysaccharide (PPSV23) vaccine. One dose is recommended after age 69. Talk to your health care provider about which screenings and vaccines you need and how often you need them. This information is not intended to replace advice given to you by your health care provider. Make sure you discuss any questions you have with your health care provider. Document Released: 05/12/2015 Document Revised: 01/03/2016 Document Reviewed: 02/14/2015 Elsevier Interactive Patient Education  2017 [place] Prevention in the Home Falls can cause injuries. They can happen to people of all ages. There are many things you can do to make your home safe and to help prevent falls. What can I do on the outside of my home?  Regularly fix the edges of walkways and driveways and fix any cracks.  Remove anything that might make you trip as you walk through a door, such as a raised step or threshold.  Trim any bushes or trees on the path to your home.  Use bright outdoor lighting.  Clear any walking paths of anything that might make someone trip, such as rocks or tools.  Regularly check to see if handrails are loose or broken. Make sure that both sides of any steps have handrails.  Any raised decks and porches should have guardrails on the edges.  Have any leaves, snow, or ice cleared regularly.  Use sand or salt on walking paths during winter.  Clean up any spills in your garage right away. This includes oil or grease spills. What can I do in the bathroom?  Use night lights.  Install grab bars by the toilet and in the tub and shower. Do not use towel bars as grab bars.  Use non-skid mats or decals in the tub or shower.  If you need to sit down in the shower, use a plastic, non-slip stool.  Keep the floor dry. Clean up any water that spills on the floor as soon  as it happens.  Remove soap buildup in the tub or shower regularly.  Attach bath mats securely with double-sided non-slip rug tape.  Do not have throw rugs and other things on the floor that can make you trip. What can I do in the bedroom?  Use night lights.  Make sure that you have a light by your bed that is easy to reach.  Do not use any sheets or blankets that are too big for your bed. They should not hang down onto the floor.  Have a firm chair that has side arms. You can use this for support while you get dressed.  Do not have throw rugs and other things on the floor that can make you trip. What can I do in the kitchen?  Clean up any spills right away.  Avoid walking on wet floors.  Keep items that you use a lot in easy-to-reach places.  If you need to reach something above you, use a strong step stool that has a grab bar.  Keep electrical cords out of the way.  Do not use floor polish or wax that makes floors slippery. If you must use wax, use non-skid floor wax.  Do  not have throw rugs and other things on the floor that can make you trip. What can I do with my stairs?  Do not leave any items on the stairs.  Make sure that there are handrails on both sides of the stairs and use them. Fix handrails that are broken or loose. Make sure that handrails are as long as the stairways.  Check any carpeting to make sure that it is firmly attached to the stairs. Fix any carpet that is loose or worn.  Avoid having throw rugs at the top or bottom of the stairs. If you do have throw rugs, attach them to the floor with carpet tape.  Make sure that you have a light switch at the top of the stairs and the bottom of the stairs. If you do not have them, ask someone to add them for you. What else can I do to help prevent falls?  Wear shoes that:  Do not have high heels.  Have rubber bottoms.  Are comfortable and fit you well.  Are closed at the toe. Do not wear sandals.  If  you use a stepladder:  Make sure that it is fully opened. Do not climb a closed stepladder.  Make sure that both sides of the stepladder are locked into place.  Ask someone to hold it for you, if possible.  Clearly mark and make sure that you can see:  Any grab bars or handrails.  First and last steps.  Where the edge of each step is.  Use tools that help you move around (mobility aids) if they are needed. These include:  Canes.  Walkers.  Scooters.  Crutches.  Turn on the lights when you go into a dark area. Replace any light bulbs as soon as they burn out.  Set up your furniture so you have a clear path. Avoid moving your furniture around.  If any of your floors are uneven, fix them.  If there are any pets around you, be aware of where they are.  Review your medicines with your doctor. Some medicines can make you feel dizzy. This can increase your chance of falling. Ask your doctor what other things that you can do to help prevent falls. This information is not intended to replace advice given to you by your health care provider. Make sure you discuss any questions you have with your health care provider. Document Released: 02/09/2009 Document Revised: 09/21/2015 Document Reviewed: 05/20/2014 Elsevier Interactive Patient Education  2017 [employer].

## 2017-08-22 NOTE — Progress Notes (Signed)
Subjective:   Sara Cannon is a 84 y.o. female who presents for Medicare Annual (Subsequent) preventive examination.  Review of Systems:   Cardiac Risk Factors include: advanced age (>66men, >32 women);hypertension;dyslipidemia     Objective:     Vitals: BP 137/75 (BP Location: Left Arm, Patient Position: Sitting)   Pulse 62   Temp 98.2 F (36.8 C) (Oral)   Resp 17   Ht 5' 4.8" (1.646 m)   Wt 146 lb 4.8 oz (66.4 kg)   LMP  (LMP Unknown)   BMI 24.50 kg/m   Body mass index is 24.5 kg/m.  Advanced Directives 08/22/2017 08/09/2016 08/06/2016 03/06/2015  Does Patient Have a Medical Advance Directive? Yes Yes Yes Yes  Type of Paramedic of [place];Living will [place];Living will [place];Living will -  Copy of [place] in Chart? No - copy requested No - copy requested - -    Tobacco Social History   Tobacco Use  Smoking Status Never Smoker  Smokeless Tobacco Never Used     Counseling given: Not Answered   Clinical Intake:  Pre-visit preparation completed: Yes  Pain : No/denies pain     Nutritional Status: BMI of 19-24  Normal Nutritional Risks: None Diabetes: No  How often do you need to have someone help you when you read instructions, pamphlets, or other written materials from your doctor or pharmacy?: 1 - Never What is the last grade level you completed in school?: 1 year college   Interpreter Needed?: No  Information entered by :: Sara Couse,LPN   Past Medical History:  Diagnosis Date  . Anxiety   . 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  . Basal cell carcinoma   . Broken arm    right  . Cancer [facility]) 2007   Descending colon, T3, N0. Adjuvant chemotherapy.  . Depression   . Diverticulitis   . Hypertension   . Hypertensive chronic kidney disease   . Hypothyroidism   . Menopausal state   . Renal failure May 2015   Dr Sara Cannon   Past Surgical History:    Procedure Laterality Date  . CATARACT EXTRACTION Bilateral   . colon resection  2007  . COLONOSCOPY  2008, 2011   Dr Bary Castilla, hyperplastic rectal polyps identified May 2008.  Marland Kitchen COLONOSCOPY WITH PROPOFOL N/A 12/14/2014   Procedure: COLONOSCOPY WITH PROPOFOL;  Surgeon: Robert Bellow, MD;  Location: [facility] ENDOSCOPY;  Service: Endoscopy;  Laterality: N/A;  . PORT-A-CATH REMOVAL  2009  . PORTACATH PLACEMENT  2007   Family History  Problem Relation Age of Onset  . Heart disease Mother   . Congestive Heart Failure Mother   . Hypertension Mother   . Leukemia Father   . Tremor Brother        x 2, 1 deceased  . Tremor Maternal Grandfather    Social History   Socioeconomic History  . Marital status: Single    Spouse name: Not on file  . Number of children: 0  . Years of education: Not on file  . Highest education level: Not on file  Occupational History  . Occupation: retired  Scientific laboratory technician  . Financial resource strain: Not hard at all  . Food insecurity:    Worry: Never true    Inability: Never true  . Transportation needs:    Medical: No    Non-medical: No  Tobacco Use  . Smoking status: Never Smoker  . Smokeless tobacco: Never  Used  Substance and Sexual Activity  . Alcohol use: No    Alcohol/week: 0.0 oz  . Drug use: No  . Sexual activity: Never  Lifestyle  . Physical activity:    Days per week: 0 days    Minutes per session: 0 min  . Stress: Not at all  Relationships  . Social connections:    Talks on phone: More than three times a week    Gets together: More than three times a week    Attends religious service: More than 4 times per year    Active member of club or organization: Yes    Attends meetings of clubs or organizations: More than 4 times per year    Relationship status: Widowed  Other Topics Concern  . Not on file  Social History Narrative  . Not on file    Outpatient Encounter Medications as of 08/22/2017  Medication Sig  . aspirin 81 MG tablet  Take 81 mg by mouth daily.  . Magnesium 250 MG TABS Take 1 tablet by mouth daily.  . Melatonin 5 MG TABS Take 1 tablet by mouth daily.  . [DISCONTINUED] levothyroxine (SYNTHROID, LEVOTHROID) 100 MCG tablet TAKE 1 TABLET(100 MCG) BY MOUTH DAILY  . [DISCONTINUED] lisinopril (PRINIVIL,ZESTRIL) 20 MG tablet Take 1 tablet (20 mg total) by mouth daily.  . [DISCONTINUED] raloxifene (EVISTA) 60 MG tablet Take 1 tablet (60 mg total) by mouth daily.   No facility-administered encounter medications on file as of 08/22/2017.     Activities of Daily Living In your present state of health, do you have any difficulty performing the following activities: 08/22/2017 08/22/2017  Hearing? N N  Vision? N N  Difficulty concentrating or making decisions? N N  Walking or climbing stairs? N N  Dressing or bathing? N N  Doing errands, shopping? N N  Preparing Food and eating ? N -  Using the Toilet? N -  In the past six months, have you accidently leaked urine? N -  Do you have problems with loss of bowel control? N -  Managing your Medications? N -  Managing your Finances? N -  Housekeeping or managing your Housekeeping? N -  Some recent data might be hidden    Patient Care Team: [name], NP as PCP - General (Nurse Practitioner) Bary Castilla Forest Gleason, MD (General Surgery) Guadalupe Maple, MD (Family Medicine) Murlean Iba, MD (Nephrology)    Assessment:   This is a routine wellness examination for Sara Cannon.  Exercise Activities and Dietary recommendations Current Exercise Habits: The patient does not participate in regular exercise at present, Exercise limited by: None identified  Goals    . DIET - INCREASE WATER INTAKE     recommend drinking at least 6-8 glasses of water a day        Fall Risk Fall Risk  08/22/2017 02/21/2017 08/06/2016 01/30/2016 04/21/2015  Falls in the past year? No Yes No No No  Number falls in past yr: - 1 - - -  Injury with Fall? - Yes - - -  Comment - broken right  arm - - -   Is the patient's home free of loose throw rugs in walkways, pet beds, electrical cords, etc?   yes      Grab bars in the bathroom? no      Handrails on the stairs?   no stairs       Adequate lighting?   yes  Timed Get Up and Go performed: Completed in 8 seconds  with no use of assistive devices, steady gait. No intervention needed at this time.   Depression Screen PHQ 2/9 Scores 08/22/2017 02/21/2017 08/06/2016 01/30/2016  PHQ - 2 Score 0 0 0 0  PHQ- 9 Score 2 0 1 0     Cognitive Function     6CIT Screen 08/22/2017  What Year? 0 points  What month? 0 points  What time? 0 points  Count back from 20 0 points  Months in reverse 0 points  Repeat phrase 0 points  Total Score 0    Immunization History  Administered Date(s) Administered  . Influenza, High Dose Seasonal PF 01/30/2016, 02/21/2017  . Influenza,inj,Quad PF,6+ Mos 03/08/2015  . Influenza,inj,quad, With Preservative 01/28/2016  . Pneumococcal Polysaccharide-23 07/11/2009, 08/22/2017  . Pneumococcal-Unspecified 07/28/2016  . Td 03/22/2003    Qualifies for Shingles Vaccine? No  Screening Tests Health Maintenance  Topic Date Due  . TETANUS/TDAP  02/21/2018 (Originally 03/21/2013)  . INFLUENZA VACCINE  11/27/2017  . PNA vac Low Risk Adult (2 of 2 - PCV13) 08/23/2018  . DEXA SCAN  Completed    Cancer Screenings: Lung: Low Dose CT Chest recommended if Age 55-80 years, 30 pack-year currently smoking OR have quit w/in 15years. Patient does not qualify. Breast:  Up to date on Mammogram? Yes  No longer required Up to date of Bone Density/Dexa? Yes no longer required Colorectal: no longer required  Additional Screenings:  Hepatitis C Screening: not indicated      Plan:    I have personally reviewed and addressed the Medicare Annual Wellness questionnaire and have noted the following in the patient's chart:  A. Medical and social history B. Use of alcohol, tobacco or illicit drugs  C. Current medications  and supplements D. Functional ability and status E.  Nutritional status F.  Physical activity G. Advance directives H. List of other physicians I.  Hospitalizations, surgeries, and ER visits in previous 12 months J.  [place] such as hearing and vision if needed, cognitive and depression L. Referrals and appointments   In addition, I have reviewed and discussed with patient certain preventive protocols, quality metrics, and best practice recommendations. A written personalized care plan for preventive services as well as general preventive health recommendations were provided to patient.   Signed,  Tyler Aas, LPN Nurse Health Advisor   Nurse Notes:none

## 2017-08-22 NOTE — Assessment & Plan Note (Signed)
Normal results 6 months ago.  Continue present

## 2017-08-22 NOTE — Patient Instructions (Signed)

## 2017-08-22 NOTE — Assessment & Plan Note (Signed)
Taking Evista.  Continue present medications

## 2017-08-22 NOTE — Progress Notes (Signed)
BP 137/75   Pulse 62   Temp 98.2 F (36.8 C) (Oral)   Ht 5' 4.8" (1.646 m)   Wt 146 lb 4.8 oz (66.4 kg)   LMP  (LMP Unknown)   SpO2 100%   BMI 24.50 kg/m    Subjective:    Patient ID: Sara Cannon, female    DOB: [birth year], 84 y.o.   MRN: [id number]  HPI: Sara Cannon is a 84 y.o. female  Chief Complaint  Patient presents with  . Hypertension    pt states she has been taking 1/2 of lisinopril, states Dr. Candiss Norse told her to do this due to lab results   Hypertension Using medications without difficulty.  Dr Candiss Norse decreased her Lisinopril from 20 mg to 10 mg.   Average home BPs Not checking   No problems or lightheadedness No chest pain with exertion or shortness of breath No Edema  Hyperkalemia Pt states Dr. Candiss Norse said her potassium was very high (above 7) and placed on a diet and as above, Lisinopril decreased.  OK on recheck but needs checked again today  Hypothyroid Thyroid panel done 6 months ago was negative.     Relevant past medical, surgical, family and social history reviewed and updated as indicated. Interim medical history since our last visit reviewed. Allergies and medications reviewed and updated.  Review of Systems  Constitutional: Negative.   Respiratory: Negative.   Cardiovascular: Negative.   Musculoskeletal: Negative.   Psychiatric/Behavioral: Negative.     Per HPI unless specifically indicated above     Objective:    BP 137/75   Pulse 62   Temp 98.2 F (36.8 C) (Oral)   Ht 5' 4.8" (1.646 m)   Wt 146 lb 4.8 oz (66.4 kg)   LMP  (LMP Unknown)   SpO2 100%   BMI 24.50 kg/m   Wt Readings from Last 3 Encounters:  08/22/17 146 lb 4.8 oz (66.4 kg)  08/22/17 146 lb 4.8 oz (66.4 kg)  02/21/17 141 lb 6.4 oz (64.1 kg)    Physical Exam  Constitutional: She is oriented to person, place, and time. She appears well-developed and well-nourished. No distress.  HENT:  Head: Normocephalic and atraumatic.  Eyes: Conjunctivae and lids are  normal. Right eye exhibits no discharge. Left eye exhibits no discharge. No scleral icterus.  Neck: Normal range of motion. Neck supple. No JVD present. Carotid bruit is not present.  Cardiovascular: Normal rate, regular rhythm and normal heart sounds.  Pulmonary/Chest: Effort normal and breath sounds normal.  Abdominal: Normal appearance. There is no splenomegaly or hepatomegaly.  Musculoskeletal: Normal range of motion.  Neurological: She is alert and oriented to person, place, and time.  Skin: Skin is warm, dry and intact. No rash noted. No pallor.  Psychiatric: She has a normal mood and affect. Her behavior is normal. Judgment and thought content normal.    Results for orders placed or performed in visit on 02/21/17  Thyroid Panel With TSH  Result Value Ref Range   TSH 1.140 0.450 - 4.500 uIU/mL   T4, Total 9.4 4.5 - 12.0 ug/dL   T3 Uptake Ratio 28 24 - 39 %   Free Thyroxine Index 2.6 1.2 - 4.9  Comprehensive metabolic panel  Result Value Ref Range   Glucose 104 (H) 65 - 99 mg/dL   BUN 28 (H) 8 - 27 mg/dL   Creatinine, Ser 1.73 (H) 0.57 - 1.00 mg/dL   GFR calc non Af Amer 27 (L) >59 mL/min/1.73  GFR calc Af Amer 31 (L) >59 mL/min/1.73   BUN/Creatinine Ratio 16 12 - 28   Sodium 141 134 - 144 mmol/L   Potassium 5.1 3.5 - 5.2 mmol/L   Chloride 105 96 - 106 mmol/L   CO2 21 20 - 29 mmol/L   Calcium 9.2 8.7 - 10.3 mg/dL   Total Protein 6.9 6.0 - 8.5 g/dL   Albumin 4.0 3.5 - 4.7 g/dL   Globulin, Total 2.9 1.5 - 4.5 g/dL   Albumin/Globulin Ratio 1.4 1.2 - 2.2   Bilirubin Total 0.3 0.0 - 1.2 mg/dL   Alkaline Phosphatase 68 39 - 117 IU/L   AST 23 0 - 40 IU/L   ALT 11 0 - 32 IU/L  VITAMIN D 25 Hydroxy (Vit-D Deficiency, Fractures)  Result Value Ref Range   Vit D, 25-Hydroxy 20.5 (L) 30.0 - 100.0 ng/mL  PTH, Intact and Calcium  Result Value Ref Range   PTH 63 15 - 65 pg/mL   PTH Interp Comment       Assessment & Plan:   Problem List Items Addressed This Visit       Unprioritized   Hyperkalemia    Check K today.  Continue to watch diet.        Relevant Orders   Comprehensive metabolic panel   Hypertensive chronic kidney disease    Stable, continue present medications. Check GFR and K with CMP       Relevant Orders   Comprehensive metabolic panel   Hypothyroidism    Normal results 6 months ago.  Continue present      Relevant Medications   levothyroxine (SYNTHROID, LEVOTHROID) 100 MCG tablet   Osteoporosis    Taking Evista.  Continue present medications      Relevant Medications   raloxifene (EVISTA) 60 MG tablet    Other Visit Diagnoses    Need for pneumococcal vaccination    -  Primary   Relevant Orders   Pneumococcal polysaccharide vaccine 23-valent greater than or equal to 2yo subcutaneous/IM       Follow up plan: Return in about 6 months (around 02/21/2018) for physical.

## 2017-08-22 NOTE — Assessment & Plan Note (Addendum)
Stable, continue present medications. Check GFR and K with CMP

## 2017-08-22 NOTE — Assessment & Plan Note (Signed)
Check K today.  Continue to watch diet.

## 2017-08-23 LAB — COMPREHENSIVE METABOLIC PANEL
A/G RATIO: 1.7 (ref 1.2–2.2)
ALBUMIN: 4 g/dL (ref 3.5–4.7)
ALT: 15 IU/L (ref 0–32)
AST: 22 IU/L (ref 0–40)
Alkaline Phosphatase: 64 IU/L (ref 39–117)
BUN/Creatinine Ratio: 21 (ref 12–28)
BUN: 31 mg/dL — ABNORMAL HIGH (ref 8–27)
Bilirubin Total: 0.3 mg/dL (ref 0.0–1.2)
CALCIUM: 8.9 mg/dL (ref 8.7–10.3)
CO2: 19 mmol/L — AB (ref 20–29)
CREATININE: 1.51 mg/dL — AB (ref 0.57–1.00)
Chloride: 106 mmol/L (ref 96–106)
GFR, EST AFRICAN AMERICAN: 37 mL/min/{1.73_m2} — AB (ref >59)
GFR, EST NON AFRICAN AMERICAN: 32 mL/min/{1.73_m2} — AB (ref >59)
GLOBULIN, TOTAL: 2.3 g/dL (ref 1.5–4.5)
Glucose: 82 mg/dL (ref 65–99)
POTASSIUM: 5.7 mmol/L — AB (ref 3.5–5.2)
SODIUM: 136 mmol/L (ref 134–144)
Total Protein: 6.3 g/dL (ref 6.0–8.5)

## 2017-08-25 ENCOUNTER — Encounter: Payer: Self-pay | Admitting: Unknown Physician Specialty

## 2017-08-29 ENCOUNTER — Telehealth: Payer: Self-pay | Admitting: Unknown Physician Specialty

## 2017-08-29 NOTE — Telephone Encounter (Signed)
Letter generated and sent to patient 08/26/27, not sure if patient received this or not. Called to let patient know about letter and what Malachy Mood said if she has not received it. Patient did not answer, left a VM asking for patient to please return my call.

## 2017-08-29 NOTE — Telephone Encounter (Signed)
Patient returned my call. Let her know what Sara Cannon said on her letter about lab results.

## 2017-08-29 NOTE — Telephone Encounter (Signed)
Patient would like Malachy Mood to call her with her lab results when she returns next week.  Thank you

## 2017-10-13 ENCOUNTER — Other Ambulatory Visit: Payer: Self-pay | Admitting: Unknown Physician Specialty

## 2017-11-21 DIAGNOSIS — N183 Chronic kidney disease, stage 3 (moderate): Secondary | ICD-10-CM | POA: Diagnosis not present

## 2017-11-21 DIAGNOSIS — N261 Atrophy of kidney (terminal): Secondary | ICD-10-CM | POA: Diagnosis not present

## 2017-11-21 DIAGNOSIS — R3129 Other microscopic hematuria: Secondary | ICD-10-CM | POA: Diagnosis not present

## 2017-11-21 DIAGNOSIS — I10 Essential (primary) hypertension: Secondary | ICD-10-CM | POA: Diagnosis not present

## 2017-11-25 DIAGNOSIS — N261 Atrophy of kidney (terminal): Secondary | ICD-10-CM | POA: Diagnosis not present

## 2017-11-25 DIAGNOSIS — R319 Hematuria, unspecified: Secondary | ICD-10-CM | POA: Diagnosis not present

## 2017-11-25 DIAGNOSIS — N183 Chronic kidney disease, stage 3 (moderate): Secondary | ICD-10-CM | POA: Diagnosis not present

## 2017-11-25 DIAGNOSIS — I10 Essential (primary) hypertension: Secondary | ICD-10-CM | POA: Diagnosis not present

## 2018-02-20 ENCOUNTER — Ambulatory Visit (INDEPENDENT_AMBULATORY_CARE_PROVIDER_SITE_OTHER): Payer: PPO | Admitting: Nurse Practitioner

## 2018-02-20 ENCOUNTER — Encounter: Payer: Self-pay | Admitting: Nurse Practitioner

## 2018-02-20 ENCOUNTER — Ambulatory Visit: Payer: PPO | Admitting: Unknown Physician Specialty

## 2018-02-20 DIAGNOSIS — N183 Chronic kidney disease, stage 3 unspecified: Secondary | ICD-10-CM | POA: Insufficient documentation

## 2018-02-20 DIAGNOSIS — Z23 Encounter for immunization: Secondary | ICD-10-CM | POA: Diagnosis not present

## 2018-02-20 DIAGNOSIS — I129 Hypertensive chronic kidney disease with stage 1 through stage 4 chronic kidney disease, or unspecified chronic kidney disease: Secondary | ICD-10-CM | POA: Diagnosis not present

## 2018-02-20 MED ORDER — RALOXIFENE HCL 60 MG PO TABS: 60.0000 mg | ORAL_TABLET | Freq: Every day | ORAL | 3 refills | Status: DC

## 2018-02-20 MED ORDER — LISINOPRIL 10 MG PO TABS
10.0000 mg | ORAL_TABLET | Freq: Every day | ORAL | 3 refills | Status: DC
Start: 2018-02-20 — End: 2018-10-01

## 2018-02-20 MED ORDER — LEVOTHYROXINE SODIUM 100 MCG PO TABS: 100.0000 ug | ORAL_TABLET | Freq: Every day | ORAL | 3 refills | Status: DC

## 2018-02-20 NOTE — Progress Notes (Signed)
BP 112/68 (BP Location: Left Arm, Patient Position: Sitting)   Pulse 90   Temp 98.2 F (36.8 C) (Oral)   Ht 5' 6"  (1.676 m)   Wt 144 lb 4.8 oz (65.5 kg)   LMP  (LMP Unknown)   SpO2 99%   BMI 23.29 kg/m    Subjective:    Patient ID: Sara Cannon, female    DOB: 1932-06-08, 84 y.o.   MRN: [id number]  HPI: Sara Cannon is a 84 y.o. female presents for HTN and CKD follow-up  Chief Complaint  Patient presents with  . Follow-up  . Hyperkalemia  . Hypertensive Kidney Disease   HYPERTENSION & HYPERKALEMIA: Her nephrologist decreased her Lisinopril dose recently and states that at her last appointment he told her her potassium was in the normal range.  Currently taking Lisinopril 10MG QDAY.  Denies SOB, CP, myalgia, confusion, chills. Hypertension status: controlled  Satisfied with current treatment? yes Duration of hypertension: chronic BP monitoring frequency:  rarely BP range:  BP medication side effects:  no Medication compliance: excellent compliance Previous BP meds: none Aspirin: no Recurrent headaches: no Visual changes: no Palpitations: no Dyspnea: no Chest pain: no Lower extremity edema: no Dizzy/lightheaded: no   CHRONIC KIDNEY DISEASE SECONDARY TO HTN: Followed by nephrology and medications are renally dosed. CKD status: controlled Medications renally dose: yes Previous renal evaluation: yes Pneumovax:  Up to Date Influenza Vaccine:  Up to Date   Relevant past medical, surgical, family and social history reviewed and updated as indicated. Interim medical history since our last visit reviewed. Allergies and medications reviewed and updated.  Review of Systems  Constitutional: Negative for activity change, appetite change, diaphoresis, fatigue and unexpected weight change.  Respiratory: Negative for cough, chest tightness and shortness of breath.   Cardiovascular: Negative for chest pain, palpitations and leg swelling.  Gastrointestinal: Negative  for abdominal distention, abdominal pain, nausea and vomiting.  Endocrine: Negative.   Genitourinary: Negative for decreased urine volume and difficulty urinating.  Musculoskeletal: Negative.   Neurological: Negative for dizziness, tremors, facial asymmetry, speech difficulty, weakness, numbness and headaches.  Psychiatric/Behavioral: Negative for behavioral problems. The patient is not nervous/anxious.    Per HPI unless specifically indicated above     Objective:    BP 112/68 (BP Location: Left Arm, Patient Position: Sitting)   Pulse 90   Temp 98.2 F (36.8 C) (Oral)   Ht 5' 6"  (1.676 m)   Wt 144 lb 4.8 oz (65.5 kg)   LMP  (LMP Unknown)   SpO2 99%   BMI 23.29 kg/m   Wt Readings from Last 3 Encounters:  02/20/18 144 lb 4.8 oz (65.5 kg)  08/22/17 146 lb 4.8 oz (66.4 kg)  08/22/17 146 lb 4.8 oz (66.4 kg)    Physical Exam  Constitutional: She is oriented to person, place, and time. She appears well-developed and well-nourished.  HENT:  Head: Normocephalic and atraumatic.  Eyes: Pupils are equal, round, and reactive to light. Conjunctivae and EOM are normal.  Neck: Normal range of motion. Neck supple. No JVD present. Carotid bruit is not present.  Cardiovascular: Normal rate, regular rhythm and normal heart sounds.  Pulmonary/Chest: Effort normal and breath sounds normal.  Abdominal: Soft. Bowel sounds are normal.  Musculoskeletal: Normal range of motion.  Neurological: She is alert and oriented to person, place, and time. She has normal strength and normal reflexes. No cranial nerve deficit.  Skin: Skin is warm and dry.  Psychiatric: She has a normal mood and affect.  Her behavior is normal. Judgment and thought content normal.  Nursing note and vitals reviewed.  Results for orders placed or performed in visit on 08/22/17  Comprehensive metabolic panel  Result Value Ref Range   Glucose 82 65 - 99 mg/dL   BUN 31 (H) 8 - 27 mg/dL   Creatinine, Ser 1.51 (H) 0.57 - 1.00 mg/dL     GFR calc non Af Amer 32 (L) >59 mL/min/1.73   GFR calc Af Amer 37 (L) >59 mL/min/1.73   BUN/Creatinine Ratio 21 12 - 28   Sodium 136 134 - 144 mmol/L   Potassium 5.7 (H) 3.5 - 5.2 mmol/L   Chloride 106 96 - 106 mmol/L   CO2 19 (L) 20 - 29 mmol/L   Calcium 8.9 8.7 - 10.3 mg/dL   Total Protein 6.3 6.0 - 8.5 g/dL   Albumin 4.0 3.5 - 4.7 g/dL   Globulin, Total 2.3 1.5 - 4.5 g/dL   Albumin/Globulin Ratio 1.7 1.2 - 2.2   Bilirubin Total 0.3 0.0 - 1.2 mg/dL   Alkaline Phosphatase 64 39 - 117 IU/L   AST 22 0 - 40 IU/L   ALT 15 0 - 32 IU/L      Assessment & Plan:   Problem List Items Addressed This Visit      Genitourinary   Hypertensive chronic kidney disease    Chronic, ongoing.  Followed by nephrology.  Continue current medication regimen and to collaborate with nephrology.      Relevant Orders   Thyroid Panel With TSH   Magnesium   Comp Met (CMET)   CKD (chronic kidney disease) stage 3, GFR 30-59 ml/min (HCC)    Chronic, ongoing.  Followed by nephrology.  Continue to collaborate with nephrology and renal dose medications. CMP today.          Follow up plan: Return in about 6 months (around 08/22/2018) for hypertension and hyperkalemia.

## 2018-02-20 NOTE — Assessment & Plan Note (Addendum)
Chronic, ongoing.  Followed by nephrology.  Continue current medication regimen and to collaborate with nephrology.

## 2018-02-20 NOTE — Patient Instructions (Signed)

## 2018-02-20 NOTE — Assessment & Plan Note (Addendum)
Chronic, ongoing.  Followed by nephrology.  Continue to collaborate with nephrology and renal dose medications. CMP today.

## 2018-02-21 LAB — COMPREHENSIVE METABOLIC PANEL
ALBUMIN: 4.1 g/dL (ref 3.5–4.7)
ALK PHOS: 64 IU/L (ref 39–117)
ALT: 10 IU/L (ref 0–32)
AST: 17 IU/L (ref 0–40)
Albumin/Globulin Ratio: 1.6 (ref 1.2–2.2)
BUN / CREAT RATIO: 20 (ref 12–28)
BUN: 30 mg/dL — AB (ref 8–27)
Bilirubin Total: 0.2 mg/dL (ref 0.0–1.2)
CHLORIDE: 105 mmol/L (ref 96–106)
CO2: 19 mmol/L — ABNORMAL LOW (ref 20–29)
Calcium: 9.3 mg/dL (ref 8.7–10.3)
Creatinine, Ser: 1.51 mg/dL — ABNORMAL HIGH (ref 0.57–1.00)
GFR calc Af Amer: 37 mL/min/{1.73_m2} — ABNORMAL LOW (ref >59)
GFR calc non Af Amer: 32 mL/min/{1.73_m2} — ABNORMAL LOW (ref >59)
GLUCOSE: 108 mg/dL — AB (ref 65–99)
Globulin, Total: 2.6 g/dL (ref 1.5–4.5)
Potassium: 4.6 mmol/L (ref 3.5–5.2)
Sodium: 140 mmol/L (ref 134–144)
Total Protein: 6.7 g/dL (ref 6.0–8.5)

## 2018-02-21 LAB — THYROID PANEL WITH TSH
Free Thyroxine Index: 3.5 (ref 1.2–4.9)
T3 UPTAKE RATIO: 31 % (ref 24–39)
T4, Total: 11.2 ug/dL (ref 4.5–12.0)
TSH: 0.229 u[IU]/mL — ABNORMAL LOW (ref 0.450–4.500)

## 2018-02-21 LAB — MAGNESIUM: MAGNESIUM: 2.5 mg/dL — AB (ref 1.6–2.3)

## 2018-02-22 ENCOUNTER — Other Ambulatory Visit: Payer: Self-pay | Admitting: Nurse Practitioner

## 2018-02-22 MED ORDER — LEVOTHYROXINE SODIUM 75 MCG PO TABS: 75.0000 ug | ORAL_TABLET | Freq: Every day | ORAL | 3 refills | Status: DC

## 2018-02-22 NOTE — Progress Notes (Signed)
Labs from 02/20/18 showed a TSH of 0.229.  Previous was 1.140.  She is currently on 100 MCG Levothyroxine daily.  Per ATA guidelines goal for patient age group TSH is <6 and tight control not recommended.  Will reduce Levothyroxine to 75 MCG every morning and recheck TSH in 6 weeks.  Informed staff to call patient with results and notify her of dose change + need for f/u.

## 2018-02-23 ENCOUNTER — Telehealth: Payer: Self-pay | Admitting: Nurse Practitioner

## 2018-02-23 NOTE — Telephone Encounter (Signed)
Copied from [place] #[id number]. Topic: Quick Communication - Lab Results (Clinic Use ONLY) >> Feb 23, 2018 11:16 AM Amada Kingfisher, CMA wrote: Called patient to inform them of recent lab results. When patient returns call, triage nurse may disclose results.

## 2018-02-23 NOTE — Telephone Encounter (Signed)
Charted in result notes. 

## 2018-04-06 ENCOUNTER — Ambulatory Visit (INDEPENDENT_AMBULATORY_CARE_PROVIDER_SITE_OTHER): Payer: PPO | Admitting: Nurse Practitioner

## 2018-04-06 ENCOUNTER — Encounter: Payer: Self-pay | Admitting: Nurse Practitioner

## 2018-04-06 ENCOUNTER — Other Ambulatory Visit: Payer: Self-pay

## 2018-04-06 VITALS — BP 104/67 | HR 71 | Temp 97.7°F | Ht 66.0 in | Wt 145.0 lb

## 2018-04-06 DIAGNOSIS — N183 Chronic kidney disease, stage 3 unspecified: Secondary | ICD-10-CM

## 2018-04-06 DIAGNOSIS — E039 Hypothyroidism, unspecified: Secondary | ICD-10-CM

## 2018-04-06 NOTE — Patient Instructions (Signed)
Hypothyroidism Hypothyroidism is a disorder of the thyroid. The thyroid is a large gland that is located in the lower front of the neck. The thyroid releases hormones that control how the body works. With hypothyroidism, the thyroid does not make enough of these hormones. What are the causes? Causes of hypothyroidism may include:  Viral infections.  Pregnancy.  Your own defense system (immune system) attacking your thyroid.  Certain medicines.  Birth defects.  Past radiation treatments to your head or neck.  Past treatment with radioactive iodine.  Past surgical removal of part or all of your thyroid.  Problems with the gland that is located in the center of your brain (pituitary).  What are the signs or symptoms? Signs and symptoms of hypothyroidism may include:  Feeling as though you have no energy (lethargy).  Inability to tolerate cold.  Weight gain that is not explained by a change in diet or exercise habits.  Dry skin.  Coarse hair.  Menstrual irregularity.  Slowing of thought processes.  Constipation.  Sadness or depression.  How is this diagnosed? Your health care provider may diagnose hypothyroidism with blood tests and ultrasound tests. How is this treated? Hypothyroidism is treated with medicine that replaces the hormones that your body does not make. After you begin treatment, it may take several weeks for symptoms to go away. Follow these instructions at home:  Take medicines only as directed by your health care provider.  If you start taking any new medicines, tell your health care provider.  Keep all follow-up visits as directed by your health care provider. This is important. As your condition improves, your dosage needs may change. You will need to have blood tests regularly so that your health care provider can watch your condition. Contact a health care provider if:  Your symptoms do not get better with treatment.  You are taking thyroid  replacement medicine and: ? You sweat excessively. ? You have tremors. ? You feel anxious. ? You lose weight rapidly. ? You cannot tolerate heat. ? You have emotional swings. ? You have diarrhea. ? You feel weak. Get help right away if:  You develop chest pain.  You develop an irregular heartbeat.  You develop a rapid heartbeat. This information is not intended to replace advice given to you by your health care provider. Make sure you discuss any questions you have with your health care provider. Document Released: 04/15/2005 Document Revised: 09/21/2015 Document Reviewed: 08/31/2013 Elsevier Interactive Patient Education  2018 [employer] Inc[employer]  

## 2018-04-06 NOTE — Assessment & Plan Note (Signed)
Chronic, ongoing.  Dose reduction 6 weeks ago to 75 MCG.  Repeat thyroid panel today.

## 2018-04-06 NOTE — Assessment & Plan Note (Signed)
Continue to collaborate with nephrology.  CMP today.

## 2018-04-06 NOTE — Progress Notes (Signed)
BP 104/67   Pulse 71   Temp 97.7 F (36.5 C) (Oral)   Ht _0  (1.676 m)   Wt 145 lb (65.8 kg)   LMP  (LMP Unknown)   SpO2 100%   BMI 23.40 kg/m    Subjective:    Patient ID: Sara Cannon, female    DOB: [birth year], 84 y.o.   MRN: [id number]  HPI: Sara Cannon is a 84 y.o. female presents for hypothyroidism f/u  Chief Complaint  Patient presents with  . Hypothyroidism    6w f/u   HYPOTHYROIDISM Dose reduction 6 weeks ago to 75 MCG daily d/t TSH 0.229.   Thyroid control status:stable Satisfied with current treatment? yes Medication side effects: no Medication compliance: excellent compliance Etiology of hypothyroidism: unknown Recent dose adjustment:yes Fatigue: no Cold intolerance: no Heat intolerance: no Weight gain: no Weight loss: no Constipation: no Diarrhea/loose stools: no Palpitations: no Lower extremity edema: no Anxiety/depressed mood: no  CKD 3: Patient requests her "kidney check" while here.  Had labs drawn one month ago, but would like them checked again.  Denies decrease in urination.  She is followed by nephrology.  Relevant past medical, surgical, family and social history reviewed and updated as indicated. Interim medical history since our last visit reviewed. Allergies and medications reviewed and updated.  Review of Systems  Constitutional: Negative for activity change, appetite change, fatigue and fever.  Respiratory: Negative for cough, chest tightness, shortness of breath and wheezing.   Cardiovascular: Negative for chest pain, palpitations and leg swelling.  Gastrointestinal: Negative for abdominal distention, abdominal pain, constipation, diarrhea, nausea and vomiting.  Endocrine: Negative for cold intolerance, heat intolerance, polydipsia, polyphagia and polyuria.  Neurological: Negative for dizziness, tremors, weakness, light-headedness, numbness and headaches.  Psychiatric/Behavioral: Negative.     Per HPI unless  specifically indicated above     Objective:    BP 104/67   Pulse 71   Temp 97.7 F (36.5 C) (Oral)   Ht _1  (1.676 m)   Wt 145 lb (65.8 kg)   LMP  (LMP Unknown)   SpO2 100%   BMI 23.40 kg/m   Wt Readings from Last 3 Encounters:  04/06/18 145 lb (65.8 kg)  02/20/18 144 lb 4.8 oz (65.5 kg)  08/22/17 146 lb 4.8 oz (66.4 kg)    Physical Exam  Constitutional: She is oriented to person, place, and time. She appears well-developed and well-nourished.  HENT:  Head: Normocephalic.  Eyes: Pupils are equal, round, and reactive to light. Conjunctivae and EOM are normal. Right eye exhibits no discharge. Left eye exhibits no discharge.  Neck: Normal range of motion. Neck supple. No JVD present. Carotid bruit is not present. No thyromegaly present.  Cardiovascular: Normal rate, regular rhythm and normal heart sounds.  Pulmonary/Chest: Effort normal and breath sounds normal.  Abdominal: Soft. Bowel sounds are normal.  Lymphadenopathy:    She has no cervical adenopathy.  Neurological: She is alert and oriented to person, place, and time.  Skin: Skin is warm and dry.  Psychiatric: She has a normal mood and affect. Her behavior is normal. Judgment and thought content normal.  Nursing note and vitals reviewed.   Results for orders placed or performed in visit on 02/20/18  Thyroid Panel With TSH  Result Value Ref Range   TSH 0.229 (L) 0.450 - 4.500 uIU/mL   T4, Total 11.2 4.5 - 12.0 ug/dL   T3 Uptake Ratio 31 24 - 39 %   Free Thyroxine Index 3.5 1.2 -  4.9  Magnesium  Result Value Ref Range   Magnesium 2.5 (H) 1.6 - 2.3 mg/dL  Comp Met (CMET)  Result Value Ref Range   Glucose 108 (H) 65 - 99 mg/dL   BUN 30 (H) 8 - 27 mg/dL   Creatinine, Ser 1.51 (H) 0.57 - 1.00 mg/dL   GFR calc non Af Amer 32 (L) >59 mL/min/1.73   GFR calc Af Amer 37 (L) >59 mL/min/1.73   BUN/Creatinine Ratio 20 12 - 28   Sodium 140 134 - 144 mmol/L   Potassium 4.6 3.5 - 5.2 mmol/L   Chloride 105 96 - 106 mmol/L     CO2 19 (L) 20 - 29 mmol/L   Calcium 9.3 8.7 - 10.3 mg/dL   Total Protein 6.7 6.0 - 8.5 g/dL   Albumin 4.1 3.5 - 4.7 g/dL   Globulin, Total 2.6 1.5 - 4.5 g/dL   Albumin/Globulin Ratio 1.6 1.2 - 2.2   Bilirubin Total 0.2 0.0 - 1.2 mg/dL   Alkaline Phosphatase 64 39 - 117 IU/L   AST 17 0 - 40 IU/L   ALT 10 0 - 32 IU/L      Assessment & Plan:   Problem List Items Addressed This Visit      Endocrine   Hypothyroidism - Primary    Chronic, ongoing.  Dose reduction 6 weeks ago to 75 MCG.  Repeat thyroid panel today.      Relevant Orders   Thyroid Panel With TSH     Genitourinary   CKD (chronic kidney disease) stage 3, GFR 30-59 ml/min (HCC)    Continue to collaborate with nephrology.  CMP today.      Relevant Orders   Comp Met (CMET)       Follow up plan: Return in about 4 months (around 08/06/2018) for Hypothyroid, CKD, HTN.

## 2018-04-07 LAB — THYROID PANEL WITH TSH
Free Thyroxine Index: 2.4 (ref 1.2–4.9)
T3 Uptake Ratio: 28 % (ref 24–39)
T4, Total: 8.5 ug/dL (ref 4.5–12.0)
TSH: 1.75 u[IU]/mL (ref 0.450–4.500)

## 2018-04-07 LAB — COMPREHENSIVE METABOLIC PANEL
ALBUMIN: 4.3 g/dL (ref 3.5–4.7)
ALK PHOS: 67 IU/L (ref 39–117)
ALT: 13 IU/L (ref 0–32)
AST: 18 IU/L (ref 0–40)
Albumin/Globulin Ratio: 2 (ref 1.2–2.2)
BUN / CREAT RATIO: 20 (ref 12–28)
BUN: 28 mg/dL — AB (ref 8–27)
Bilirubin Total: 0.3 mg/dL (ref 0.0–1.2)
CALCIUM: 9.2 mg/dL (ref 8.7–10.3)
CO2: 20 mmol/L (ref 20–29)
CREATININE: 1.37 mg/dL — AB (ref 0.57–1.00)
Chloride: 102 mmol/L (ref 96–106)
GFR, EST AFRICAN AMERICAN: 41 mL/min/{1.73_m2} — AB (ref >59)
GFR, EST NON AFRICAN AMERICAN: 36 mL/min/{1.73_m2} — AB (ref >59)
GLUCOSE: 110 mg/dL — AB (ref 65–99)
Globulin, Total: 2.2 g/dL (ref 1.5–4.5)
Potassium: 4.8 mmol/L (ref 3.5–5.2)
Sodium: 137 mmol/L (ref 134–144)
TOTAL PROTEIN: 6.5 g/dL (ref 6.0–8.5)

## 2018-04-13 DIAGNOSIS — L821 Other seborrheic keratosis: Secondary | ICD-10-CM | POA: Diagnosis not present

## 2018-04-13 DIAGNOSIS — C44519 Basal cell carcinoma of skin of other part of trunk: Secondary | ICD-10-CM | POA: Diagnosis not present

## 2018-04-13 DIAGNOSIS — L570 Actinic keratosis: Secondary | ICD-10-CM | POA: Diagnosis not present

## 2018-04-13 DIAGNOSIS — Z85828 Personal history of other malignant neoplasm of skin: Secondary | ICD-10-CM | POA: Diagnosis not present

## 2018-04-13 DIAGNOSIS — D485 Neoplasm of uncertain behavior of skin: Secondary | ICD-10-CM | POA: Diagnosis not present

## 2018-05-14 DIAGNOSIS — N183 Chronic kidney disease, stage 3 (moderate): Secondary | ICD-10-CM | POA: Diagnosis not present

## 2018-05-14 DIAGNOSIS — R3129 Other microscopic hematuria: Secondary | ICD-10-CM | POA: Diagnosis not present

## 2018-05-14 DIAGNOSIS — I10 Essential (primary) hypertension: Secondary | ICD-10-CM | POA: Diagnosis not present

## 2018-05-14 DIAGNOSIS — N261 Atrophy of kidney (terminal): Secondary | ICD-10-CM | POA: Diagnosis not present

## 2018-05-19 DIAGNOSIS — N261 Atrophy of kidney (terminal): Secondary | ICD-10-CM | POA: Diagnosis not present

## 2018-05-19 DIAGNOSIS — I129 Hypertensive chronic kidney disease with stage 1 through stage 4 chronic kidney disease, or unspecified chronic kidney disease: Secondary | ICD-10-CM | POA: Diagnosis not present

## 2018-05-19 DIAGNOSIS — N183 Chronic kidney disease, stage 3 (moderate): Secondary | ICD-10-CM | POA: Diagnosis not present

## 2018-05-25 DIAGNOSIS — C44519 Basal cell carcinoma of skin of other part of trunk: Secondary | ICD-10-CM | POA: Diagnosis not present

## 2018-05-25 DIAGNOSIS — C4491 Basal cell carcinoma of skin, unspecified: Secondary | ICD-10-CM | POA: Diagnosis not present

## 2018-08-21 ENCOUNTER — Ambulatory Visit: Payer: PPO | Admitting: Nurse Practitioner

## 2018-08-26 ENCOUNTER — Other Ambulatory Visit: Payer: Self-pay

## 2018-08-26 ENCOUNTER — Ambulatory Visit: Payer: PPO | Admitting: Nurse Practitioner

## 2018-08-26 ENCOUNTER — Ambulatory Visit (INDEPENDENT_AMBULATORY_CARE_PROVIDER_SITE_OTHER): Payer: PPO

## 2018-08-26 VITALS — Ht 66.5 in | Wt 145.0 lb

## 2018-08-26 DIAGNOSIS — Z0000 Encounter for general adult medical examination without abnormal findings: Secondary | ICD-10-CM | POA: Diagnosis not present

## 2018-08-26 NOTE — Progress Notes (Signed)
Subjective:   Sara Cannon is a 85 y.o. female who presents for Medicare Annual (Subsequent) preventive examination.  This visit is being conducted via phone call after attempting video due to the COVID-19 pandemic. This patient has given me verbal consent via phone to conduct this visit, patient states they are participating from their home address. Some vital signs may be absent or patient reported.   Patient identification: identified by name, DOB, and current address.    Review of Systems:  Cardiac Risk Factors include: hypertension     Objective:     Vitals: LMP  (LMP Unknown)   There is no height or weight on file to calculate BMI.  Advanced Directives 08/26/2018 08/22/2017 08/09/2016 08/06/2016 03/06/2015  Does Patient Have a Medical Advance Directive? Yes Yes Yes Yes Yes  Type of Paramedic of [place];Living will [place];Living will [place];Living will [place];Living will -  Copy of [place] in Chart? No - copy requested No - copy requested No - copy requested - -    Tobacco Social History   Tobacco Use  Smoking Status Never Smoker  Smokeless Tobacco Never Used     Counseling given: Not Answered   Clinical Intake:  Pre-visit preparation completed: Yes  Pain : No/denies pain     Nutritional Risks: None Diabetes: No  How often do you need to have someone help you when you read instructions, pamphlets, or other written materials from your doctor or pharmacy?: 1 - Never  Interpreter Needed?: No  Information entered by :: Abbigaile Rockman,LPN   Past Medical History:  Diagnosis Date  . Anxiety   . 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  . Basal cell carcinoma   . Broken arm    right  . Cancer [facility]) 2007   Descending colon, T3, N0. Adjuvant chemotherapy.  . Depression   . Diverticulitis   . Hypertension   . Hypertensive chronic kidney disease    . Hypothyroidism   . Menopausal state   . Renal failure May 2015   Dr Candiss Norse   Past Surgical History:  Procedure Laterality Date  . CATARACT EXTRACTION Bilateral   . colon resection  2007  . COLONOSCOPY  2008, 2011   Dr Bary Castilla, hyperplastic rectal polyps identified May 2008.  Marland Kitchen COLONOSCOPY WITH PROPOFOL N/A 12/14/2014   Procedure: COLONOSCOPY WITH PROPOFOL;  Surgeon: Robert Bellow, MD;  Location: [facility] ENDOSCOPY;  Service: Endoscopy;  Laterality: N/A;  . PORT-A-CATH REMOVAL  2009  . PORTACATH PLACEMENT  2007   Family History  Problem Relation Age of Onset  . Heart disease Mother   . Congestive Heart Failure Mother   . Hypertension Mother   . Leukemia Father   . Tremor Brother        x 2, 1 deceased  . Tremor Maternal Grandfather    Social History   Socioeconomic History  . Marital status: Single    Spouse name: Not on file  . Number of children: 0  . Years of education: 1 year college   . Highest education level: Some college, no degree  Occupational History  . Occupation: retired  Scientific laboratory technician  . Financial resource strain: Not hard at all  . Food insecurity:    Worry: Never true    Inability: Never true  . Transportation needs:    Medical: No    Non-medical: No  Tobacco Use  . Smoking status: Never Smoker  .  Smokeless tobacco: Never Used  Substance and Sexual Activity  . Alcohol use: No    Alcohol/week: 0.0 standard drinks  . Drug use: No  . Sexual activity: Never  Lifestyle  . Physical activity:    Days per week: 0 days    Minutes per session: 0 min  . Stress: Not at all  Relationships  . Social connections:    Talks on phone: More than three times a week    Gets together: More than three times a week    Attends religious service: More than 4 times per year    Active member of club or organization: Yes    Attends meetings of clubs or organizations: More than 4 times per year    Relationship status: Widowed  Other Topics Concern  . 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 aspirin 81 MG tablet Take 81 mg by mouth daily.  Marland Kitchen levothyroxine (SYNTHROID, LEVOTHROID) 75 MCG tablet Take 1 tablet (75 mcg total) by mouth daily.  Marland Kitchen lisinopril (PRINIVIL,ZESTRIL) 10 MG tablet Take 1 tablet (10 mg total) by mouth daily.  . Melatonin 5 MG TABS Take 1 tablet by mouth daily.  . raloxifene (EVISTA) 60 MG tablet Take 1 tablet (60 mg total) by mouth daily.   No facility-administered encounter medications on file as of 08/26/2018.     Activities of Daily Living In your present state of health, do you have any difficulty performing the following activities: 08/26/2018  Hearing? N  Vision? N  Difficulty concentrating or making decisions? N  Walking or climbing stairs? N  Dressing or bathing? N  Doing errands, shopping? N  Preparing Food and eating ? N  Using the Toilet? N  In the past six months, have you accidently leaked urine? N  Do you have problems with loss of bowel control? N  Managing your Medications? N  Managing your Finances? N  Housekeeping or managing your Housekeeping? N  Some recent data might be hidden    Patient Care Team: Venita Lick, NP as PCP - General (Nurse Practitioner) Robert Bellow, MD (General Surgery) Guadalupe Maple, MD (Family Medicine) Murlean Iba, MD (Nephrology)    Assessment:   This is a routine wellness examination for Sara Cannon.  Exercise Activities and Dietary recommendations Current Exercise Habits: Home exercise routine, Type of exercise: walking, Time (Minutes): 30, Frequency (Times/Week): 5, Weekly Exercise (Minutes/Week): 150, Exercise limited by: None identified  Goals    . DIET - INCREASE WATER INTAKE     recommend drinking at least 6-8 glasses of water a day        Fall Risk: Fall Risk  08/26/2018 08/22/2017 02/21/2017 08/06/2016 01/30/2016  Falls in  the past year? 0 No Yes No No  Number falls in past yr: - - 1 - -  Injury with Fall? - - Yes - -  Comment - - broken right arm - -    FALL RISK PREVENTION PERTAINING TO THE HOME:  Any stairs in or around the home? Yes  If so, are there any without handrails? No   Home free of loose throw rugs in walkways, pet beds, electrical cords, etc? Yes  Adequate lighting in your home to reduce risk of falls? Yes   ASSISTIVE DEVICES UTILIZED TO PREVENT FALLS:  Life alert? No  Use of a cane, walker  or w/c? No  Grab bars in the bathroom? No  Shower chair or bench in shower? No  Elevated toilet seat or a handicapped toilet? No   DME ORDERS:  DME order needed?  No   TIMED UP AND GO:  Unable to perform    Depression Screen PHQ 2/9 Scores 08/26/2018 08/22/2017 02/21/2017 08/06/2016  PHQ - 2 Score 0 0 0 0  PHQ- 9 Score - 2 0 1     Cognitive Function     6CIT Screen 08/26/2018 08/22/2017  What Year? 0 points 0 points  What month? 0 points 0 points  What time? 0 points 0 points  Count back from 20 0 points 0 points  Months in reverse 0 points 0 points  Repeat phrase 0 points 0 points  Total Score 0 0    Immunization History  Administered Date(s) Administered  . Influenza, High Dose Seasonal PF 01/30/2016, 02/21/2017, 02/20/2018  . Influenza,inj,Quad PF,6+ Mos 03/08/2015  . Influenza,inj,quad, With Preservative 01/28/2016  . Pneumococcal Polysaccharide-23 07/11/2009, 08/22/2017  . Pneumococcal-Unspecified 07/28/2016  . Td 03/22/2003    Qualifies for Shingles Vaccine? No   Tdap: due, patient will get if needed  Flu Vaccine: up to date   Pneumococcal Vaccine: up to date   Screening Tests Health Maintenance  Topic Date Due  . TETANUS/TDAP  03/21/2013  . PNA vac Low Risk Adult (2 of 2 - PCV13) 08/23/2018  . INFLUENZA VACCINE  11/28/2018  . DEXA SCAN  Completed    Cancer Screenings:  Colorectal Screening: no longer required   Mammogram: has done at [facility]   Bone  Density: Completed 07/13/2007  Lung Cancer Screening: (Low Dose CT Chest recommended if Age 84-80 years, 30 pack-year currently smoking OR have quit w/in 15years.) does not qualify.    Additional Screening:    Vision Screening: Recommended annual ophthalmology exams for early detection of glaucoma and other disorders of the eye. Is the patient up to date with their annual eye exam?  No  Who is the provider or what is the name of the office in which the pt attends annual eye exams? [facility]    Dental Screening: Recommended annual dental exams for proper oral hygiene  Community Resource Referral:  CRR required this visit?  No       Plan:  I have personally reviewed and addressed the Medicare Annual Wellness questionnaire and have noted the following in the patient's chart:  A. Medical and social history B. Use of alcohol, tobacco or illicit drugs  C. Current medications and supplements D. Functional ability and status E.  Nutritional status F.  Physical activity G. Advance directives H. List of other physicians I.  Hospitalizations, surgeries, and ER visits in previous 12 months J.  [place] such as hearing and vision if needed, cognitive and depression L. Referrals and appointments   In addition, I have reviewed and discussed with patient certain preventive protocols, quality metrics, and best practice recommendations. A written personalized care plan for preventive services as well as general preventive health recommendations were provided to patient.  Signed,    Bevelyn Ngo, LPN  4/[id number] Nurse Health Advisor   Nurse Notes: none

## 2018-08-26 NOTE — Patient Instructions (Addendum)
Sara Cannon , Thank you for taking time to come for your Medicare Wellness Visit. I appreciate your ongoing commitment to your health goals. Please review the following plan we discussed and let me know if I can assist you in the future.   Screening recommendations/referrals: Colonoscopy: no longer required Mammogram: completing at [facility] Bone Density: completed, no longer required Recommended yearly ophthalmology/optometry visit for glaucoma screening and checkup Recommended yearly dental visit for hygiene and checkup  Vaccinations: Influenza vaccine: up to date Pneumococcal vaccine: up to date Tdap vaccine: due, check with your insurance company for coverage  Shingles vaccine: not indicated    Advanced directives: Please bring a copy of your health care power of attorney and living will to the office at your convenience.  Conditions/risks identified: continue to be safe!  Next appointment: Follow up in one year for your annual wellness exam.    Preventive Care 65 Years and Older, Female Preventive care refers to lifestyle choices and visits with your health care provider that can promote health and wellness. What does preventive care include?  A yearly physical exam. This is also called an annual well check.  Dental exams once or twice a year.  Routine eye exams. Ask your health care provider how often you should have your eyes checked.  Personal lifestyle choices, including:  Daily care of your teeth and gums.  Regular physical activity.  Eating a healthy diet.  Avoiding tobacco and drug use.  Limiting alcohol use.  Practicing safe sex.  Taking low-dose aspirin every day.  Taking vitamin and mineral supplements as recommended by your health care provider. What happens during an annual well check? The services and screenings done by your health care provider during your annual well check will depend on your age, overall health, lifestyle risk factors, and family  history of disease. Counseling  Your health care provider may ask you questions about your:  Alcohol use.  Tobacco use.  Drug use.  Emotional well-being.  Home and relationship well-being.  Sexual activity.  Eating habits.  History of falls.  Memory and ability to understand (cognition).  Work and work Statistician.  Reproductive health. Screening  You may have the following tests or measurements:  Height, weight, and BMI.  Blood pressure.  Lipid and cholesterol levels. These may be checked every 5 years, or more frequently if you are over 32 years old.  Skin check.  Lung cancer screening. You may have this screening every year starting at age 54 if you have a 30-pack-year history of smoking and currently smoke or have quit within the past 15 years.  Fecal occult blood test (FOBT) of the stool. You may have this test every year starting at age 45.  Flexible sigmoidoscopy or colonoscopy. You may have a sigmoidoscopy every 5 years or a colonoscopy every 10 years starting at age 37.  Hepatitis C blood test.  Hepatitis B blood test.  Sexually transmitted disease (STD) testing.  Diabetes screening. This is done by checking your blood sugar (glucose) after you have not eaten for a while (fasting). You may have this done every 1-3 years.  Bone density scan. This is done to screen for osteoporosis. You may have this done starting at age 52.  Mammogram. This may be done every 1-2 years. Talk to your health care provider about how often you should have regular mammograms. Talk with your health care provider about your test results, treatment options, and if necessary, the need for more tests. Vaccines  Your  health care provider may recommend certain vaccines, such as:  Influenza vaccine. This is recommended every year.  Tetanus, diphtheria, and acellular pertussis (Tdap, Td) vaccine. You may need a Td booster every 10 years.  Zoster vaccine. You may need this after  age 14.  Pneumococcal 13-valent conjugate (PCV13) vaccine. One dose is recommended after age 36.  Pneumococcal polysaccharide (PPSV23) vaccine. One dose is recommended after age 34. Talk to your health care provider about which screenings and vaccines you need and how often you need them. This information is not intended to replace advice given to you by your health care provider. Make sure you discuss any questions you have with your health care provider. Document Released: 05/12/2015 Document Revised: 01/03/2016 Document Reviewed: 02/14/2015 Elsevier Interactive Patient Education  2017 [place] Prevention in the Home Falls can cause injuries. They can happen to people of all ages. There are many things you can do to make your home safe and to help prevent falls. What can I do on the outside of my home?  Regularly fix the edges of walkways and driveways and fix any cracks.  Remove anything that might make you trip as you walk through a door, such as a raised step or threshold.  Trim any bushes or trees on the path to your home.  Use bright outdoor lighting.  Clear any walking paths of anything that might make someone trip, such as rocks or tools.  Regularly check to see if handrails are loose or broken. Make sure that both sides of any steps have handrails.  Any raised decks and porches should have guardrails on the edges.  Have any leaves, snow, or ice cleared regularly.  Use sand or salt on walking paths during winter.  Clean up any spills in your garage right away. This includes oil or grease spills. What can I do in the bathroom?  Use night lights.  Install grab bars by the toilet and in the tub and shower. Do not use towel bars as grab bars.  Use non-skid mats or decals in the tub or shower.  If you need to sit down in the shower, use a plastic, non-slip stool.  Keep the floor dry. Clean up any water that spills on the floor as soon as it happens.   Remove soap buildup in the tub or shower regularly.  Attach bath mats securely with double-sided non-slip rug tape.  Do not have throw rugs and other things on the floor that can make you trip. What can I do in the bedroom?  Use night lights.  Make sure that you have a light by your bed that is easy to reach.  Do not use any sheets or blankets that are too big for your bed. They should not hang down onto the floor.  Have a firm chair that has side arms. You can use this for support while you get dressed.  Do not have throw rugs and other things on the floor that can make you trip. What can I do in the kitchen?  Clean up any spills right away.  Avoid walking on wet floors.  Keep items that you use a lot in easy-to-reach places.  If you need to reach something above you, use a strong step stool that has a grab bar.  Keep electrical cords out of the way.  Do not use floor polish or wax that makes floors slippery. If you must use wax, use non-skid floor wax.  Do not  have throw rugs and other things on the floor that can make you trip. What can I do with my stairs?  Do not leave any items on the stairs.  Make sure that there are handrails on both sides of the stairs and use them. Fix handrails that are broken or loose. Make sure that handrails are as long as the stairways.  Check any carpeting to make sure that it is firmly attached to the stairs. Fix any carpet that is loose or worn.  Avoid having throw rugs at the top or bottom of the stairs. If you do have throw rugs, attach them to the floor with carpet tape.  Make sure that you have a light switch at the top of the stairs and the bottom of the stairs. If you do not have them, ask someone to add them for you. What else can I do to help prevent falls?  Wear shoes that:  Do not have high heels.  Have rubber bottoms.  Are comfortable and fit you well.  Are closed at the toe. Do not wear sandals.  If you use a  stepladder:  Make sure that it is fully opened. Do not climb a closed stepladder.  Make sure that both sides of the stepladder are locked into place.  Ask someone to hold it for you, if possible.  Clearly mark and make sure that you can see:  Any grab bars or handrails.  First and last steps.  Where the edge of each step is.  Use tools that help you move around (mobility aids) if they are needed. These include:  Canes.  Walkers.  Scooters.  Crutches.  Turn on the lights when you go into a dark area. Replace any light bulbs as soon as they burn out.  Set up your furniture so you have a clear path. Avoid moving your furniture around.  If any of your floors are uneven, fix them.  If there are any pets around you, be aware of where they are.  Review your medicines with your doctor. Some medicines can make you feel dizzy. This can increase your chance of falling. Ask your doctor what other things that you can do to help prevent falls. This information is not intended to replace advice given to you by your health care provider. Make sure you discuss any questions you have with your health care provider. Document Released: 02/09/2009 Document Revised: 09/21/2015 Document Reviewed: 05/20/2014 Elsevier Interactive Patient Education  2017 [employer].

## 2018-08-28 ENCOUNTER — Ambulatory Visit: Payer: Self-pay

## 2018-10-01 ENCOUNTER — Other Ambulatory Visit: Payer: Self-pay | Admitting: Unknown Physician Specialty

## 2018-10-01 NOTE — Telephone Encounter (Signed)
Can you call her and ensure she has follow-up scheduled for June or July.  Thanks.

## 2018-10-01 NOTE — Telephone Encounter (Signed)
Requested medication (s) are due for refill today: yes  Requested medication (s) are on the active medication list: yes  Last refill:  02/20/18  Future visit scheduled: No  Needs follow up appointment  Notes to clinic:  Needs appointment    Requested Prescriptions  Pending Prescriptions Disp Refills   lisinopril (ZESTRIL) 10 MG tablet [Pharmacy Med Name: LISINOPRIL 10MG  TABLETS] 90 tablet 3    Sig: TAKE 1 TABLET(10 MG) BY MOUTH DAILY     Cardiovascular:  ACE Inhibitors Failed - 10/01/2018  3:14 AM      Failed - Cr in normal range and within 180 days    Creatinine  Date Value Ref Range Status  02/28/2014 1.58 (H) 0.60 - 1.30 mg/dL Final   Creatinine, Ser  Date Value Ref Range Status  04/06/2018 1.37 (H) 0.57 - 1.00 mg/dL Final         Passed - K in normal range and within 180 days    Potassium  Date Value Ref Range Status  04/06/2018 4.8 3.5 - 5.2 mmol/L Final  02/28/2014 4.7 3.5 - 5.1 mmol/L Final         Passed - Patient is not pregnant      Passed - Last BP in normal range    BP Readings from Last 1 Encounters:  04/06/18 104/67         Passed - Valid encounter within last 6 months    Recent Outpatient Visits          5 months ago Hypothyroidism, unspecified type   [place], Jolene T, NP   7 months ago Hypertensive kidney disease with stage 3 chronic kidney disease ([place])   [place], Henrine Screws T, NP   1 year ago Need for pneumococcal vaccination   [facility] [name], NP   1 year ago Need for influenza vaccination   [facility] [name], NP   2 years ago Encounter for screening mammogram for breast cancer   [facility] [name], NP

## 2018-10-13 ENCOUNTER — Encounter: Payer: Self-pay | Admitting: Nurse Practitioner

## 2018-10-13 ENCOUNTER — Ambulatory Visit (INDEPENDENT_AMBULATORY_CARE_PROVIDER_SITE_OTHER): Payer: PPO | Admitting: Nurse Practitioner

## 2018-10-13 ENCOUNTER — Other Ambulatory Visit: Payer: Self-pay

## 2018-10-13 VITALS — BP 118/78 | HR 76 | Temp 98.2°F

## 2018-10-13 DIAGNOSIS — I129 Hypertensive chronic kidney disease with stage 1 through stage 4 chronic kidney disease, or unspecified chronic kidney disease: Secondary | ICD-10-CM

## 2018-10-13 DIAGNOSIS — E039 Hypothyroidism, unspecified: Secondary | ICD-10-CM | POA: Diagnosis not present

## 2018-10-13 DIAGNOSIS — N183 Chronic kidney disease, stage 3 (moderate): Secondary | ICD-10-CM | POA: Diagnosis not present

## 2018-10-13 MED ORDER — LISINOPRIL 10 MG PO TABS: ORAL_TABLET | ORAL | 3 refills | Status: DC

## 2018-10-13 MED ORDER — LEVOTHYROXINE SODIUM 75 MCG PO TABS
75.0000 ug | ORAL_TABLET | Freq: Every day | ORAL | 3 refills | Status: DC
Start: 2018-10-13 — End: 2019-05-11

## 2018-10-13 MED ORDER — RALOXIFENE HCL 60 MG PO TABS: 60.0000 mg | ORAL_TABLET | Freq: Every day | ORAL | 3 refills | Status: DC

## 2018-10-13 NOTE — Progress Notes (Signed)
BP 118/78 (BP Location: Left Arm, Patient Position: Sitting)   Pulse 76   Temp 98.2 F (36.8 C) (Oral)   LMP  (LMP Unknown)   SpO2 99%    Subjective:    Patient ID: Sara Cannon, female    DOB: 03-05-1933, 85 y.o.   MRN: [id number]  HPI: Sara Cannon is a 85 y.o. female  Chief Complaint  Patient presents with  . Hypertension  . Hypothyroidism   HYPERTENSION Walks 2 miles three times a week.  Lisinopril 10 MG daily.  Sees nephrology, last saw in January.  Sees Dr. Candiss Norse, next in July.  States she was told to start iron on Wednesday and Saturday by nephrology. Hypertension status: controlled  Satisfied with current treatment? no Duration of hypertension: chronic BP monitoring frequency:  not checking BP range:  BP medication side effects:  no Medication compliance: good compliance Aspirin: no Recurrent headaches: no Visual changes: no Palpitations: no Dyspnea: no Chest pain: no Lower extremity edema: no Dizzy/lightheaded: no   HYPOTHYROIDISM Last TSH was 1.750 04/06/18. Levothyroxine 75 MCG daily. Thyroid control status:stable Satisfied with current treatment? no Medication side effects: no Medication compliance: good compliance Etiology of hypothyroidism:  Recent dose adjustment:no Fatigue: no Cold intolerance: no Heat intolerance: no Weight gain: no Weight loss: no Constipation: no Diarrhea/loose stools: no Palpitations: no Lower extremity edema: no Anxiety/depressed mood: no  Relevant past medical, surgical, family and social history reviewed and updated as indicated. Interim medical history since our last visit reviewed. Allergies and medications reviewed and updated.  Review of Systems  Constitutional: Negative for activity change, appetite change, diaphoresis, fatigue and fever.  Respiratory: Negative for cough, chest tightness and shortness of breath.   Cardiovascular: Negative for chest pain, palpitations and leg swelling.   Gastrointestinal: Negative for abdominal distention, abdominal pain, constipation, diarrhea, nausea and vomiting.  Endocrine: Negative for cold intolerance, heat intolerance, polydipsia, polyphagia and polyuria.  Neurological: Negative for dizziness, syncope, weakness, light-headedness, numbness and headaches.  Psychiatric/Behavioral: Negative.     Per HPI unless specifically indicated above     Objective:    BP 118/78 (BP Location: Left Arm, Patient Position: Sitting)   Pulse 76   Temp 98.2 F (36.8 C) (Oral)   LMP  (LMP Unknown)   SpO2 99%   Wt Readings from Last 3 Encounters:  08/26/18 145 lb (65.8 kg)  04/06/18 145 lb (65.8 kg)  02/20/18 144 lb 4.8 oz (65.5 kg)    Physical Exam Vitals signs and nursing note reviewed.  Constitutional:      General: She is awake. She is not in acute distress.    Appearance: She is well-developed. She is not ill-appearing.  HENT:     Head: Normocephalic.     Right Ear: Hearing normal.     Left Ear: Hearing normal.     Nose: Nose normal.     Mouth/Throat:     Mouth: Mucous membranes are moist.  Eyes:     General: Lids are normal.        Right eye: No discharge.        Left eye: No discharge.     Conjunctiva/sclera: Conjunctivae normal.     Pupils: Pupils are equal, round, and reactive to light.  Neck:     Musculoskeletal: Normal range of motion and neck supple.     Thyroid: No thyromegaly.     Vascular: No carotid bruit or JVD.  Cardiovascular:     Rate and Rhythm: Normal rate and  regular rhythm.     Heart sounds: Normal heart sounds. No murmur. No gallop.   Pulmonary:     Effort: Pulmonary effort is normal. No accessory muscle usage or respiratory distress.     Breath sounds: Normal breath sounds.  Abdominal:     General: Bowel sounds are normal.     Palpations: Abdomen is soft. There is no hepatomegaly or splenomegaly.  Musculoskeletal:     Right lower leg: No edema.     Left lower leg: No edema.  Lymphadenopathy:      Cervical: No cervical adenopathy.  Skin:    General: Skin is warm and dry.  Neurological:     Mental Status: She is alert and oriented to person, place, and time.  Psychiatric:        Attention and Perception: Attention normal.        Mood and Affect: Mood normal.        Behavior: Behavior normal. Behavior is cooperative.        Thought Content: Thought content normal.        Judgment: Judgment normal.     Results for orders placed or performed in visit on 04/06/18  Thyroid Panel With TSH  Result Value Ref Range   TSH 1.750 0.450 - 4.500 uIU/mL   T4, Total 8.5 4.5 - 12.0 ug/dL   T3 Uptake Ratio 28 24 - 39 %   Free Thyroxine Index 2.4 1.2 - 4.9  Comprehensive metabolic panel  Result Value Ref Range   Glucose 110 (H) 65 - 99 mg/dL   BUN 28 (H) 8 - 27 mg/dL   Creatinine, Ser 1.37 (H) 0.57 - 1.00 mg/dL   GFR calc non Af Amer 36 (L) >59 mL/min/1.73   GFR calc Af Amer 41 (L) >59 mL/min/1.73   BUN/Creatinine Ratio 20 12 - 28   Sodium 137 134 - 144 mmol/L   Potassium 4.8 3.5 - 5.2 mmol/L   Chloride 102 96 - 106 mmol/L   CO2 20 20 - 29 mmol/L   Calcium 9.2 8.7 - 10.3 mg/dL   Total Protein 6.5 6.0 - 8.5 g/dL   Albumin 4.3 3.5 - 4.7 g/dL   Globulin, Total 2.2 1.5 - 4.5 g/dL   Albumin/Globulin Ratio 2.0 1.2 - 2.2   Bilirubin Total 0.3 0.0 - 1.2 mg/dL   Alkaline Phosphatase 67 39 - 117 IU/L   AST 18 0 - 40 IU/L   ALT 13 0 - 32 IU/L      Assessment & Plan:   Problem List Items Addressed This Visit      Endocrine   Hypothyroidism - Primary    Chronic, ongoing.  Continue current medication regimen and adjust as needed based on TSH today.      Relevant Medications   levothyroxine (SYNTHROID) 75 MCG tablet   Other Relevant Orders   Thyroid Panel With TSH     Genitourinary   Hypertensive chronic kidney disease    Chronic, ongoing.  Initial BP elevated, but repeat below goal.  Continue current medication regimen and collaboration with nephrology.  CMP, CBC, anemia panel today  at patient request..        Relevant Orders   CBC with Differential/Platelet   Anemia panel   Comprehensive metabolic panel       Follow up plan: Return in about 6 months (around 04/14/2019) for HTN, thyroid.

## 2018-10-13 NOTE — Patient Instructions (Signed)
Hypothyroidism  Hypothyroidism is when the thyroid gland does not make enough of certain hormones (it is underactive). The thyroid gland is a small gland located in the lower front part of the neck, just in front of the windpipe (trachea). This gland makes hormones that help control how the body uses food for energy (metabolism) as well as how the heart and brain function. These hormones also play a role in keeping your bones strong. When the thyroid is underactive, it produces too little of the hormones thyroxine (T4) and triiodothyronine (T3). What are the causes? This condition may be caused by:  Hashimoto's disease. This is a disease in which the body's disease-fighting system (immune system) attacks the thyroid gland. This is the most common cause.  Viral infections.  Pregnancy.  Certain medicines.  Birth defects.  Past radiation treatments to the head or neck for cancer.  Past treatment with radioactive iodine.  Past exposure to radiation in the environment.  Past surgical removal of part or all of the thyroid.  Problems with a gland in the center of the brain (pituitary gland).  Lack of enough iodine in the diet. What increases the risk? You are more likely to develop this condition if:  You are female.  You have a family history of thyroid conditions.  You use a medicine called lithium.  You take medicines that affect the immune system (immunosuppressants). What are the signs or symptoms? Symptoms of this condition include:  Feeling as though you have no energy (lethargy).  Not being able to tolerate cold.  Weight gain that is not explained by a change in diet or exercise habits.  Lack of appetite.  Dry skin.  Coarse hair.  Menstrual irregularity.  Slowing of thought processes.  Constipation.  Sadness or depression. How is this diagnosed? This condition may be diagnosed based on:  Your symptoms, your medical history, and a physical exam.  Blood  tests. You may also have imaging tests, such as an ultrasound or MRI. How is this treated? This condition is treated with medicine that replaces the thyroid hormones that your body does not make. After you begin treatment, it may take several weeks for symptoms to go away. Follow these instructions at home:  Take over-the-counter and prescription medicines only as told by your health care provider.  If you start taking any new medicines, tell your health care provider.  Keep all follow-up visits as told by your health care provider. This is important. ? As your condition improves, your dosage of thyroid hormone medicine may change. ? You will need to have blood tests regularly so that your health care provider can monitor your condition. Contact a health care provider if:  Your symptoms do not get better with treatment.  You are taking thyroid replacement medicine and you: ? Sweat a lot. ? Have tremors. ? Feel anxious. ? Lose weight rapidly. ? Cannot tolerate heat. ? Have emotional swings. ? Have diarrhea. ? Feel weak. Get help right away if you have:  Chest pain.  An irregular heartbeat.  A rapid heartbeat.  Difficulty breathing. Summary  Hypothyroidism is when the thyroid gland does not make enough of certain hormones (it is underactive).  When the thyroid is underactive, it produces too little of the hormones thyroxine (T4) and triiodothyronine (T3).  The most common cause is Hashimoto's disease, a disease in which the body's disease-fighting system (immune system) attacks the thyroid gland. The condition can also be caused by viral infections, medicine, pregnancy, or past   radiation treatment to the head or neck.  Symptoms may include weight gain, dry skin, constipation, feeling as though you do not have energy, and not being able to tolerate cold.  This condition is treated with medicine to replace the thyroid hormones that your body does not make. This information  is not intended to replace advice given to you by your health care provider. Make sure you discuss any questions you have with your health care provider. Document Released: 04/15/2005 Document Revised: 03/26/2017 Document Reviewed: 03/26/2017 Elsevier Interactive Patient Education  2019 Elsevier Inc.  

## 2018-10-13 NOTE — Assessment & Plan Note (Signed)
Chronic, ongoing.  Continue current medication regimen and adjust as needed based on TSH today.

## 2018-10-13 NOTE — Assessment & Plan Note (Signed)
Chronic, ongoing.  Initial BP elevated, but repeat below goal.  Continue current medication regimen and collaboration with nephrology.  CMP, CBC, anemia panel today at patient request..

## 2018-10-14 LAB — CBC WITH DIFFERENTIAL/PLATELET
Basophils Absolute: 0.1 10*3/uL (ref 0.0–0.2)
Basos: 1 %
EOS (ABSOLUTE): 0.2 10*3/uL (ref 0.0–0.4)
Eos: 2 %
Hemoglobin: 11.8 g/dL (ref 11.1–15.9)
Immature Grans (Abs): 0 10*3/uL (ref 0.0–0.1)
Immature Granulocytes: 0 %
Lymphocytes Absolute: 1.7 10*3/uL (ref 0.7–3.1)
Lymphs: 19 %
MCH: 31.6 pg (ref 26.6–33.0)
MCHC: 33.4 g/dL (ref 31.5–35.7)
MCV: 94 fL (ref 79–97)
Monocytes Absolute: 0.9 10*3/uL (ref 0.1–0.9)
Monocytes: 10 %
Neutrophils Absolute: 6 10*3/uL (ref 1.4–7.0)
Neutrophils: 68 %
Platelets: 175 10*3/uL (ref 150–450)
RBC: 3.74 x10E6/uL — ABNORMAL LOW (ref 3.77–5.28)
RDW: 12.4 % (ref 11.7–15.4)
WBC: 8.8 10*3/uL (ref 3.4–10.8)

## 2018-10-14 LAB — COMPREHENSIVE METABOLIC PANEL
ALT: 11 IU/L (ref 0–32)
AST: 18 IU/L (ref 0–40)
Albumin/Globulin Ratio: 1.6 (ref 1.2–2.2)
Albumin: 4.5 g/dL (ref 3.6–4.6)
Alkaline Phosphatase: 63 IU/L (ref 39–117)
BUN/Creatinine Ratio: 18 (ref 12–28)
BUN: 24 mg/dL (ref 8–27)
Bilirubin Total: 0.3 mg/dL (ref 0.0–1.2)
CO2: 21 mmol/L (ref 20–29)
Calcium: 9.4 mg/dL (ref 8.7–10.3)
Chloride: 104 mmol/L (ref 96–106)
Creatinine, Ser: 1.36 mg/dL — ABNORMAL HIGH (ref 0.57–1.00)
GFR calc Af Amer: 41 mL/min/{1.73_m2} — ABNORMAL LOW (ref >59)
GFR calc non Af Amer: 36 mL/min/{1.73_m2} — ABNORMAL LOW (ref >59)
Globulin, Total: 2.8 g/dL (ref 1.5–4.5)
Glucose: 106 mg/dL — ABNORMAL HIGH (ref 65–99)
Potassium: 5.1 mmol/L (ref 3.5–5.2)
Sodium: 139 mmol/L (ref 134–144)
Total Protein: 7.3 g/dL (ref 6.0–8.5)

## 2018-10-14 LAB — ANEMIA PANEL
Ferritin: 140 ng/mL (ref 15–150)
Folate, Hemolysate: 415 ng/mL
Folate, RBC: 1176 ng/mL (ref >498)
Hematocrit: 35.3 % (ref 34.0–46.6)
Iron Saturation: 31 % (ref 15–55)
Iron: 81 ug/dL (ref 27–139)
Retic Ct Pct: 0.9 % (ref 0.6–2.6)
Total Iron Binding Capacity: 258 ug/dL (ref 250–450)
UIBC: 177 ug/dL (ref 118–369)
Vitamin B-12: 578 pg/mL (ref 232–1245)

## 2018-10-14 LAB — THYROID PANEL WITH TSH
Free Thyroxine Index: 2.5 (ref 1.2–4.9)
T3 Uptake Ratio: 25 % (ref 24–39)
T4, Total: 10 ug/dL (ref 4.5–12.0)
TSH: 2.27 u[IU]/mL (ref 0.450–4.500)

## 2019-02-04 ENCOUNTER — Ambulatory Visit (INDEPENDENT_AMBULATORY_CARE_PROVIDER_SITE_OTHER): Payer: PPO

## 2019-02-04 ENCOUNTER — Other Ambulatory Visit: Payer: Self-pay

## 2019-02-04 DIAGNOSIS — Z23 Encounter for immunization: Secondary | ICD-10-CM | POA: Diagnosis not present

## 2019-02-08 DIAGNOSIS — R3129 Other microscopic hematuria: Secondary | ICD-10-CM | POA: Diagnosis not present

## 2019-02-08 DIAGNOSIS — I129 Hypertensive chronic kidney disease with stage 1 through stage 4 chronic kidney disease, or unspecified chronic kidney disease: Secondary | ICD-10-CM | POA: Diagnosis not present

## 2019-02-08 DIAGNOSIS — N261 Atrophy of kidney (terminal): Secondary | ICD-10-CM | POA: Diagnosis not present

## 2019-02-08 DIAGNOSIS — N183 Chronic kidney disease, stage 3 unspecified: Secondary | ICD-10-CM | POA: Diagnosis not present

## 2019-02-11 DIAGNOSIS — N261 Atrophy of kidney (terminal): Secondary | ICD-10-CM | POA: Insufficient documentation

## 2019-02-11 DIAGNOSIS — I129 Hypertensive chronic kidney disease with stage 1 through stage 4 chronic kidney disease, or unspecified chronic kidney disease: Secondary | ICD-10-CM | POA: Diagnosis not present

## 2019-02-11 DIAGNOSIS — N1832 Chronic kidney disease, stage 3b: Secondary | ICD-10-CM | POA: Diagnosis not present

## 2019-05-11 ENCOUNTER — Encounter: Payer: Self-pay | Admitting: Nurse Practitioner

## 2019-05-11 ENCOUNTER — Ambulatory Visit (INDEPENDENT_AMBULATORY_CARE_PROVIDER_SITE_OTHER): Payer: PPO | Admitting: Nurse Practitioner

## 2019-05-11 ENCOUNTER — Other Ambulatory Visit: Payer: Self-pay

## 2019-05-11 VITALS — BP 145/76 | HR 78

## 2019-05-11 DIAGNOSIS — I129 Hypertensive chronic kidney disease with stage 1 through stage 4 chronic kidney disease, or unspecified chronic kidney disease: Secondary | ICD-10-CM

## 2019-05-11 DIAGNOSIS — E039 Hypothyroidism, unspecified: Secondary | ICD-10-CM

## 2019-05-11 DIAGNOSIS — N1832 Chronic kidney disease, stage 3b: Secondary | ICD-10-CM | POA: Diagnosis not present

## 2019-05-11 DIAGNOSIS — M810 Age-related osteoporosis without current pathological fracture: Secondary | ICD-10-CM

## 2019-05-11 MED ORDER — RALOXIFENE HCL 60 MG PO TABS: 60.0000 mg | ORAL_TABLET | Freq: Every day | ORAL | 3 refills | Status: DC

## 2019-05-11 MED ORDER — LISINOPRIL 10 MG PO TABS: ORAL_TABLET | ORAL | 3 refills | Status: DC

## 2019-05-11 MED ORDER — LEVOTHYROXINE SODIUM 75 MCG PO TABS: 75.0000 ug | ORAL_TABLET | Freq: Every day | ORAL | 3 refills | Status: DC

## 2019-05-11 NOTE — Assessment & Plan Note (Signed)
Chronic, ongoing.  At goal at recent nephrology office visit and at goal for age at home, avoid hypotension.  Continue current medication regimen and collaboration with nephrology.  CMP, CBC, anemia panel at next visit.

## 2019-05-11 NOTE — Progress Notes (Signed)
BP (!) 145/76   Pulse 78   LMP  (LMP Unknown)    Subjective:    Patient ID: Sara Cannon, female    DOB: [birth year], 86 y.o.   MRN: [id number]  HPI: Sara Cannon is a 86 y.o. female  Chief Complaint  Patient presents with  . Hypertension  . Hypothyroidism    . This visit was completed via telephone due to the restrictions of the COVID-19 pandemic. All issues as above were discussed and addressed but no physical exam was performed. If it was felt that the patient should be evaluated in the office, they were directed there. The patient verbally consented to this visit. Patient was unable to complete an audio/visual visit due to Lack of equipment. Due to the catastrophic nature of the COVID-19 pandemic, this visit was done through audio contact only. . Location of the patient: home . Location of the provider: work . Those involved with this call:  . Provider: Marnee Guarneri, DNP . CMA: Yvonna Alanis, CMA . Front Desk/Registration: Don Perking  . Time spent on call: 15 minutes on the phone discussing health concerns. 10 minutes total spent in review of patient's record and preparation of their chart.  . I verified patient identity using two factors (patient name and date of birth). Patient consents verbally to being seen via telemedicine visit today.    HYPOTHYROIDISM Continues on Levothyroxine 75 MCG daily with last TSH in June 2.270 and T4 10. Thyroid control status:stable Satisfied with current treatment? yes Medication side effects: no Medication compliance: good compliance Etiology of hypothyroidism:  Recent dose adjustment:no Fatigue: no Cold intolerance: no Heat intolerance: no Weight gain: no Weight loss: no Constipation: no Diarrhea/loose stools: no Palpitations: no Lower extremity edema: no Anxiety/depressed mood: no   HYPERTENSION Hypertension status: stable  Satisfied with current treatment? yes Duration of hypertension: chronic BP  monitoring frequency:  daily BP range: 111/67 with nephrologist and 130-140/70 average at home BP medication side effects:  no Medication compliance: good compliance Previous BP meds: Aspirin: yes Recurrent headaches: no Visual changes: no Palpitations: no Dyspnea: no Chest pain: no Lower extremity edema: no Dizzy/lightheaded: no   CHRONIC KIDNEY DISEASE Had October labs with CRT 1.38 and GFR 35, K+ 5.2, and NA+ 137.  Sees Dr. Candiss Norse and last in October.  Current CrCl 20.45. CKD status: stable Medications renally dose: yes Previous renal evaluation: yes Pneumovax:  Up to Date Influenza Vaccine:  Up to Date  OSTEOPOROSIS Currently takes Evista.  Last DEXA in 2009, unable to pull results up on Epic.  Satisfied with current treatment?: yes Medication side effects: no Medication compliance: good compliance Past osteoporosis medications/treatments:  Adequate calcium & vitamin D: yes Intolerance to bisphosphonates:no Weight bearing exercises: yes  Relevant past medical, surgical, family and social history reviewed and updated as indicated. Interim medical history since our last visit reviewed. Allergies and medications reviewed and updated.  Review of Systems  Constitutional: Negative for activity change, appetite change, diaphoresis, fatigue and fever.  Respiratory: Negative for cough, chest tightness, shortness of breath and wheezing.   Cardiovascular: Negative for chest pain, palpitations and leg swelling.  Gastrointestinal: Negative.   Neurological: Negative.   Psychiatric/Behavioral: Negative.     Per HPI unless specifically indicated above     Objective:    BP (!) 145/76   Pulse 78   LMP  (LMP Unknown)   Wt Readings from Last 3 Encounters:  08/26/18 145 lb (65.8 kg)  04/06/18 145 lb (65.8 kg)  02/20/18 144 lb 4.8 oz (65.5 kg)    Physical Exam   Unable to perform due to telephone visit only.  Results for orders placed or performed in visit on 10/13/18  CBC  with Differential/Platelet  Result Value Ref Range   WBC 8.8 3.4 - 10.8 x10E3/uL   RBC 3.74 (L) 3.77 - 5.28 x10E6/uL   Hemoglobin 11.8 11.1 - 15.9 g/dL   MCV 94 79 - 97 fL   MCH 31.6 26.6 - 33.0 pg   MCHC 33.4 31.5 - 35.7 g/dL   RDW 12.4 11.7 - 15.4 %   Platelets 175 150 - 450 x10E3/uL   Neutrophils 68 Not Estab. %   Lymphs 19 Not Estab. %   Monocytes 10 Not Estab. %   Eos 2 Not Estab. %   Basos 1 Not Estab. %   Neutrophils Absolute 6.0 1.4 - 7.0 x10E3/uL   Lymphocytes Absolute 1.7 0.7 - 3.1 x10E3/uL   Monocytes Absolute 0.9 0.1 - 0.9 x10E3/uL   EOS (ABSOLUTE) 0.2 0.0 - 0.4 x10E3/uL   Basophils Absolute 0.1 0.0 - 0.2 x10E3/uL   Immature Granulocytes 0 Not Estab. %   Immature Grans (Abs) 0.0 0.0 - 0.1 x10E3/uL  Anemia panel  Result Value Ref Range   Total Iron Binding Capacity 258 250 - 450 ug/dL   UIBC 177 118 - 369 ug/dL   Iron 81 27 - 139 ug/dL   Iron Saturation 31 15 - 55 %   Vitamin B-12 578 232 - 1,245 pg/mL   Folate, Hemolysate 415.0 Not Estab. ng/mL   Hematocrit 35.3 34.0 - 46.6 %   Folate, RBC 1,176 >498 ng/mL   Ferritin 140 15 - 150 ng/mL   Retic Ct Pct 0.9 0.6 - 2.6 %  Comprehensive metabolic panel  Result Value Ref Range   Glucose 106 (H) 65 - 99 mg/dL   BUN 24 8 - 27 mg/dL   Creatinine, Ser 1.36 (H) 0.57 - 1.00 mg/dL   GFR calc non Af Amer 36 (L) >59 mL/min/1.73   GFR calc Af Amer 41 (L) >59 mL/min/1.73   BUN/Creatinine Ratio 18 12 - 28   Sodium 139 134 - 144 mmol/L   Potassium 5.1 3.5 - 5.2 mmol/L   Chloride 104 96 - 106 mmol/L   CO2 21 20 - 29 mmol/L   Calcium 9.4 8.7 - 10.3 mg/dL   Total Protein 7.3 6.0 - 8.5 g/dL   Albumin 4.5 3.6 - 4.6 g/dL   Globulin, Total 2.8 1.5 - 4.5 g/dL   Albumin/Globulin Ratio 1.6 1.2 - 2.2   Bilirubin Total 0.3 0.0 - 1.2 mg/dL   Alkaline Phosphatase 63 39 - 117 IU/L   AST 18 0 - 40 IU/L   ALT 11 0 - 32 IU/L  Thyroid Panel With TSH  Result Value Ref Range   TSH 2.270 0.450 - 4.500 uIU/mL   T4, Total 10.0 4.5 - 12.0  ug/dL   T3 Uptake Ratio 25 24 - 39 %   Free Thyroxine Index 2.5 1.2 - 4.9      Assessment & Plan:   Problem List Items Addressed This Visit      Endocrine   Hypothyroidism    Chronic, ongoing.  Continue current medication regimen and adjust as needed based on TSH.  Will plan on repeat TSH at next visit, she prefers not to get labs at this time due to Covid.      Relevant Medications   levothyroxine (SYNTHROID) 75 MCG tablet  Musculoskeletal and Integument   Osteoporosis    Chronic, ongoing.  Continue Evista.  Plan on repeat DEXA once Covid numbers improve, she does not wish to obtain at this time.      Relevant Medications   raloxifene (EVISTA) 60 MG tablet     Genitourinary   Hypertensive chronic kidney disease - Primary    Chronic, ongoing.  At goal at recent nephrology office visit and at goal for age at home, avoid hypotension.  Continue current medication regimen and collaboration with nephrology.  CMP, CBC, anemia panel at next visit.      CKD (chronic kidney disease) stage 3, GFR 30-59 ml/min    Chronic, ongoing. Continue collaboration with nephrology.  Recent labs in October.  Will repeat labs next visit.           I discussed the assessment and treatment plan with the patient. The patient was provided an opportunity to ask questions and all were answered. The patient agreed with the plan and demonstrated an understanding of the instructions.   The patient was advised to call back or seek an in-person evaluation if the symptoms worsen or if the condition fails to improve as anticipated.   I provided 15 minutes of time during this encounter.  Follow up plan: Return in about 6 months (around 11/08/2019) for Annual physical.

## 2019-05-11 NOTE — Assessment & Plan Note (Signed)
Chronic, ongoing.  Continue current medication regimen and adjust as needed based on TSH.  Will plan on repeat TSH at next visit, she prefers not to get labs at this time due to Covid.

## 2019-05-11 NOTE — Patient Instructions (Signed)
Osteoporosis  Osteoporosis happens when your bones get thin and weak. This can cause your bones to break (fracture) more easily. You can do things at home to make your bones stronger. Follow these instructions at home:  Activity  Exercise as told by your doctor. Ask your doctor what activities are safe for you. You should do: ? Exercises that make your muscles work to hold your body weight up (weight-bearing exercises). These include tai chi, yoga, and walking. ? Exercises to make your muscles stronger. One example is lifting weights. Lifestyle  Limit alcohol intake to no more than 1 drink a day for nonpregnant women and 2 drinks a day for men. One drink equals 12 oz of beer, 5 oz of wine, or 1 oz of hard liquor.  Do not use any products that have nicotine or tobacco in them. These include cigarettes and e-cigarettes. If you need help quitting, ask your doctor. Preventing falls  Use tools to help you move around (mobility aids) as needed. These include canes, walkers, scooters, and crutches.  Keep rooms well-lit and free of clutter.  Put away things that could make you trip. These include cords and rugs.  Install safety rails on stairs. Install grab bars in bathrooms.  Use rubber mats in slippery areas, like bathrooms.  Wear shoes that: ? Fit you well. ? Support your feet. ? Have closed toes. ? Have rubber soles or low heels.  Tell your doctor about all of the medicines you are taking. Some medicines can make you more likely to fall. General instructions  Eat plenty of calcium and vitamin D. These nutrients are good for your bones. Good sources of calcium and vitamin D include: ? Some fatty fish, such as salmon and tuna. ? Foods that have calcium and vitamin D added to them (fortified foods). For example, some breakfast cereals are fortified with calcium and vitamin D. ? Egg yolks. ? Cheese. ? Liver.  Take over-the-counter and prescription medicines only as told by your  doctor.  Keep all follow-up visits as told by your doctor. This is important. Contact a doctor if:  You have not been tested (screened) for osteoporosis and you are: ? A woman who is age 65 or older. ? A man who is age 70 or older. Get help right away if:  You fall.  You get hurt. Summary  Osteoporosis happens when your bones get thin and weak.  Weak bones can break (fracture) more easily.  Eat plenty of calcium and vitamin D. These nutrients are good for your bones.  Tell your doctor about all of the medicines that you take. This information is not intended to replace advice given to you by your health care provider. Make sure you discuss any questions you have with your health care provider. Document Revised: 03/28/2017 Document Reviewed: 02/07/2017 Elsevier Patient Education  2020 [employer] Inc.  

## 2019-05-11 NOTE — Assessment & Plan Note (Signed)
Chronic, ongoing.  Continue Evista.  Plan on repeat DEXA once Covid numbers improve, she does not wish to obtain at this time.

## 2019-05-11 NOTE — Assessment & Plan Note (Signed)
Chronic, ongoing. Continue collaboration with nephrology.  Recent labs in October.  Will repeat labs next visit.

## 2019-05-28 ENCOUNTER — Telehealth: Payer: Self-pay | Admitting: Nurse Practitioner

## 2019-05-28 NOTE — Chronic Care Management (AMB) (Signed)
  Chronic Care Management   Note  05/28/2019 Name: ODALY PERI MRN: [id number] DOB: 1932/11/30  GEORGIAN MCCLORY is a 86 y.o. year old female who is a primary care patient of Cannady, Barbaraann Faster, NP. I reached out to [name] by phone today in response to a referral sent by Ms. Sela Hilding Pilley's health plan.     Ms. Baik was given information about Chronic Care Management services today including:  1. CCM service includes personalized support from designated clinical staff supervised by her physician, including individualized plan of care and coordination with other care providers 2. 24/7 contact phone numbers for assistance for urgent and routine care needs. 3. Service will only be billed when office clinical staff spend 20 minutes or more in a month to coordinate care. 4. Only one practitioner may furnish and bill the service in a calendar month. 5. The patient may stop CCM services at any time (effective at the end of the month) by phone call to the office staff. 6. The patient will be responsible for cost sharing (co-pay) of up to 20% of the service fee (after annual deductible is met).  Patient did not agree to enrollment in care management services and does not wish to consider at this time.  Follow up plan: The patient has been provided with contact information for the care management team and has been advised to call with any health related questions or concerns.   Noreene Larsson, [place], [place], [place] [address] Direct Dial: [phone] [email or web address] Website: [email or web address]

## 2019-07-02 ENCOUNTER — Encounter: Payer: Self-pay | Admitting: Nurse Practitioner

## 2019-08-11 DIAGNOSIS — N1832 Chronic kidney disease, stage 3b: Secondary | ICD-10-CM | POA: Diagnosis not present

## 2019-08-11 DIAGNOSIS — N261 Atrophy of kidney (terminal): Secondary | ICD-10-CM | POA: Diagnosis not present

## 2019-08-11 DIAGNOSIS — I129 Hypertensive chronic kidney disease with stage 1 through stage 4 chronic kidney disease, or unspecified chronic kidney disease: Secondary | ICD-10-CM | POA: Diagnosis not present

## 2019-08-19 DIAGNOSIS — N261 Atrophy of kidney (terminal): Secondary | ICD-10-CM | POA: Diagnosis not present

## 2019-08-19 DIAGNOSIS — N1832 Chronic kidney disease, stage 3b: Secondary | ICD-10-CM | POA: Diagnosis not present

## 2019-08-19 DIAGNOSIS — I10 Essential (primary) hypertension: Secondary | ICD-10-CM | POA: Diagnosis not present

## 2019-08-23 ENCOUNTER — Other Ambulatory Visit: Payer: Self-pay

## 2019-08-23 ENCOUNTER — Ambulatory Visit (INDEPENDENT_AMBULATORY_CARE_PROVIDER_SITE_OTHER): Payer: PPO

## 2019-08-23 ENCOUNTER — Ambulatory Visit: Payer: PPO | Admitting: Podiatry

## 2019-08-23 ENCOUNTER — Encounter: Payer: Self-pay | Admitting: Podiatry

## 2019-08-23 VITALS — Temp 97.6°F

## 2019-08-23 DIAGNOSIS — M722 Plantar fascial fibromatosis: Secondary | ICD-10-CM

## 2019-08-23 MED ORDER — METHYLPREDNISOLONE 4 MG PO TBPK: ORAL_TABLET | ORAL | 0 refills | Status: DC

## 2019-08-23 NOTE — Progress Notes (Signed)
Subjective:  Patient ID: Sara Cannon, female    DOB: 1933/02/27,  MRN: [id number] HPI Chief Complaint  Patient presents with  . Foot Pain    Patient presents today for left heel pain 4-5 months  She states "its sharp, stabbing pains and aches most of the time"  She has orthotics and has been using ice for relief    86 y.o. female presents with the above complaint.   ROS: Denies fever chills nausea vomiting muscle aches pains calf pain back pain chest pain shortness of breath.  Past Medical History:  Diagnosis Date  . Anxiety   . 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  . Basal cell carcinoma   . Broken arm    right  . [facility] 2007   Descending colon, T3, N0. Adjuvant chemotherapy.  . Depression   . Diverticulitis   . Hypertension   . Hypertensive chronic kidney disease   . Hypothyroidism   . Menopausal state   . Renal failure May 2015   Dr Candiss Norse   Past Surgical History:  Procedure Laterality Date  . CATARACT EXTRACTION Bilateral   . colon resection  2007  . COLONOSCOPY  2008, 2011   Dr Bary Castilla, hyperplastic rectal polyps identified May 2008.  [facility] Kitchen COLONOSCOPY WITH PROPOFOL N/A 12/14/2014   Procedure: COLONOSCOPY WITH PROPOFOL;  Surgeon: Robert Bellow, MD;  Location: [facility] ENDOSCOPY;  Service: Endoscopy;  Laterality: N/A;  . PORT-A-CATH REMOVAL  2009  . PORTACATH PLACEMENT  2007    Current Outpatient Medications:  .  aspirin 81 MG tablet, Take 81 mg by mouth daily., Disp: , Rfl:  .  Docusate Calcium (STOOL SOFTENER PO), Take 1 capsule by mouth daily., Disp: , Rfl:  .  levothyroxine (SYNTHROID) 75 MCG tablet, Take 1 tablet (75 mcg total) by mouth daily., Disp: 90 tablet, Rfl: 3 .  lisinopril (ZESTRIL) 10 MG tablet, TAKE 1 TABLET(10 MG) BY MOUTH DAILY, Disp: 90 tablet, Rfl: 3 .  Melatonin 5 MG TABS, Take 1 tablet by mouth daily., Disp: , Rfl:  .  raloxifene (EVISTA) 60 MG tablet, Take 1 tablet (60 mg total) by mouth daily., Disp: 90 tablet, Rfl: 3  No Known  Allergies Review of Systems Objective:   Vitals:   08/23/19 1329  Temp: 97.6 F (36.4 C)    General: Well developed, nourished, in no acute distress, alert and oriented x3   Dermatological: Skin is warm, dry and supple bilateral. Nails x 10 are well maintained; remaining integument appears unremarkable at this time. There are no open sores, no preulcerative lesions, no rash or signs of infection present.  Vascular: Dorsalis Pedis artery and Posterior Tibial artery pedal pulses are 2/4 bilateral with immedate capillary fill time. Pedal hair growth present. No varicosities and no lower extremity edema present bilateral.   Neruologic: Grossly intact via light touch bilateral. Vibratory intact via tuning fork bilateral. Protective threshold with Semmes Wienstein monofilament intact to all pedal sites bilateral. Patellar and Achilles deep tendon reflexes 2+ bilateral. No Babinski or clonus noted bilateral.   Musculoskeletal: No gross boney pedal deformities bilateral. No pain, crepitus, or limitation noted with foot and ankle range of motion bilateral. Muscular strength 5/5 in all groups tested bilateral.  Pain on palpation medial calcaneal tubercles of the left foot.  No pain on medial lateral compression of the calcaneus.  Gait: Unassisted, Nonantalgic.    Radiographs:  Radiographs taken today demonstrate moderate osteopenia of an osseously mature individual plantar distally oriented calcaneal spur and a  soft tissue increase in density at the plantar pressure calcaneal insertion site.  No fractures are identified in the rear foot.  No fractures in the forefoot or ankle.  Assessment & Plan:   Assessment: Plantar fasciitis left heel.  Fat pad atrophy left.  Plan: Discussed etiology pathology conservative versus surgical therapies injected 20 mg of Kenalog to the plantar fascial site at the calcaneus left foot.  She was also put on a Medrol Dosepak but we did not do any NSAIDs because of her  stage III kidney failure.  Placed in a plantar fascial brace and discussed appropriate shoe gear stretching exercises ice therapy and shoe gear modifications.     Marios Gaiser T. [place], [place]

## 2019-08-30 ENCOUNTER — Ambulatory Visit: Payer: PPO

## 2019-10-01 DIAGNOSIS — L648 Other androgenic alopecia: Secondary | ICD-10-CM | POA: Diagnosis not present

## 2019-10-01 DIAGNOSIS — L989 Disorder of the skin and subcutaneous tissue, unspecified: Secondary | ICD-10-CM | POA: Diagnosis not present

## 2019-10-01 DIAGNOSIS — L821 Other seborrheic keratosis: Secondary | ICD-10-CM | POA: Diagnosis not present

## 2019-10-04 ENCOUNTER — Ambulatory Visit: Payer: PPO | Admitting: Podiatry

## 2019-11-15 DIAGNOSIS — L659 Nonscarring hair loss, unspecified: Secondary | ICD-10-CM | POA: Diagnosis not present

## 2019-11-23 ENCOUNTER — Encounter: Payer: PPO | Admitting: Nurse Practitioner

## 2019-11-29 ENCOUNTER — Other Ambulatory Visit: Payer: Self-pay

## 2019-11-29 ENCOUNTER — Ambulatory Visit: Payer: PPO | Admitting: Podiatry

## 2019-11-29 ENCOUNTER — Encounter: Payer: Self-pay | Admitting: Podiatry

## 2019-11-29 DIAGNOSIS — M722 Plantar fascial fibromatosis: Secondary | ICD-10-CM

## 2019-11-29 MED ORDER — METHYLPREDNISOLONE 4 MG PO TBPK: ORAL_TABLET | ORAL | 0 refills | Status: DC

## 2019-11-29 NOTE — Progress Notes (Signed)
She presents today for follow-up of her left heel states that was doing okay for a while but now it sore and now it is hurting.  States that she got some over-the-counter orthotics this seemed to help.  Objective: Vital signs are stable alert and oriented x3.  Pulses are palpable.  She has pain on palpation medial calcaneal tubercle of the left heel.  Assessment: Plantar fasciitis left.  Plan: Discussed etiology pathology conservative versus surgical therapies.  At this point I reinjected her heel today and I will follow-up with her in the near future if necessary.

## 2019-12-07 ENCOUNTER — Encounter: Payer: Self-pay | Admitting: Nurse Practitioner

## 2019-12-07 ENCOUNTER — Other Ambulatory Visit: Payer: Self-pay

## 2019-12-07 ENCOUNTER — Ambulatory Visit (INDEPENDENT_AMBULATORY_CARE_PROVIDER_SITE_OTHER): Payer: PPO | Admitting: Nurse Practitioner

## 2019-12-07 VITALS — BP 109/69 | HR 68 | Temp 98.2°F | Ht 64.5 in | Wt 148.6 lb

## 2019-12-07 DIAGNOSIS — N1832 Chronic kidney disease, stage 3b: Secondary | ICD-10-CM | POA: Diagnosis not present

## 2019-12-07 DIAGNOSIS — E039 Hypothyroidism, unspecified: Secondary | ICD-10-CM

## 2019-12-07 DIAGNOSIS — I129 Hypertensive chronic kidney disease with stage 1 through stage 4 chronic kidney disease, or unspecified chronic kidney disease: Secondary | ICD-10-CM

## 2019-12-07 DIAGNOSIS — M810 Age-related osteoporosis without current pathological fracture: Secondary | ICD-10-CM | POA: Diagnosis not present

## 2019-12-07 DIAGNOSIS — Z0000 Encounter for general adult medical examination without abnormal findings: Secondary | ICD-10-CM

## 2019-12-07 NOTE — Assessment & Plan Note (Signed)
Chronic, ongoing.  At goal in office today and at goal for age at home, avoid hypotension.  Continue current medication regimen and collaboration with nephrology.  CMP and CBC today.  Recommend continue to monitor BP at home regularly, focus on DASH diet, and avoid nephrotoxic medications.  Return in 6 months.

## 2019-12-07 NOTE — Assessment & Plan Note (Signed)
Chronic, ongoing.  Continue collaboration with nephrology.  CMP today.

## 2019-12-07 NOTE — Assessment & Plan Note (Addendum)
Chronic, ongoing.  Continue Evista.  Patient declines a repeat DEXA at this time.  Vit D level today. 

## 2019-12-07 NOTE — Progress Notes (Signed)
BP 109/69    Pulse 68    Temp 98.2 F (36.8 C) (Oral)    Ht 5' 4.5" (1.638 m)    Wt 148 lb 9.6 oz (67.4 kg)    LMP  (LMP Unknown)    SpO2 97%    BMI 25.11 kg/m    Subjective:    Patient ID: Sara Cannon, female    DOB: 15-Mar-1933, 86 y.o.   MRN: [id number]  HPI: Sara Cannon is a 86 y.o. female presenting on 12/07/2019 for comprehensive medical examination. Current medical complaints include:none  She currently lives with: self Menopausal Symptoms: no   HYPOTHYROIDISM Continues on Levothyroxine 75 MCG daily with last TSH in June 2020 -- 2.270 and T4 10. Thyroid control status:stable Satisfied with current treatment? yes Medication side effects: no Medication compliance: good compliance Etiology of hypothyroidism:  Recent dose adjustment:no Fatigue: no Cold intolerance: no Heat intolerance: no Weight gain: no Weight loss: no Constipation: no Diarrhea/loose stools: no Palpitations: no Lower extremity edema: no Anxiety/depressed mood: no   HYPERTENSION Hypertension status: stable  Satisfied with current treatment? yes Duration of hypertension: chronic BP monitoring frequency:  daily BP range: 123/68 with nephrologist and 130-140/70 average at home BP medication side effects:  no Medication compliance: good compliance Previous BP meds: Aspirin: yes Recurrent headaches: no Visual changes: no Palpitations: no Dyspnea: no Chest pain: no Lower extremity edema: no Dizzy/lightheaded: no   CHRONIC KIDNEY DISEASE Had April labs with CRT 1.54, BUN 32, and GFR 30, K+ 4.7, and NA+ 137.  Sees Dr. Candiss Norse and last in April.   CKD status: stable Medications renally dose: yes Previous renal evaluation: yes Pneumovax:  Up to Date Influenza Vaccine:  Up to Date  OSTEOPOROSIS Currently takes Evista.  Last DEXA in 2009, unable to pull results up on Epic.  She does not wish to obtain a repeat DEXA, discussed at length with her. Satisfied with current treatment?:  yes Medication side effects: no Medication compliance: good compliance Past osteoporosis medications/treatments:  Adequate calcium & vitamin D: yes Intolerance to bisphosphonates:no Weight bearing exercises: yes  Depression Screen done today and results listed below:  Depression screen [facility] 2/9 12/07/2019 05/11/2019 08/26/2018 08/22/2017 02/21/2017  Decreased Interest 0 0 0 0 0  Down, Depressed, Hopeless 0 0 0 0 0  PHQ - 2 Score 0 0 0 0 0  Altered sleeping 0 1 - 2 0  Tired, decreased energy 2 0 - 0 0  Change in appetite 0 0 - 0 0  Feeling bad or failure about yourself  0 0 - 0 0  Trouble concentrating 0 0 - 0 0  Moving slowly or fidgety/restless 0 0 - 0 0  Suicidal thoughts 0 0 - 0 0  PHQ-9 Score 2 1 - 2 0  Difficult doing work/chores Not difficult at all Not difficult at all - - -    The patient does not have a history of falls. I did not complete a risk assessment for falls. A plan of care for falls was not documented.   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   Basal cell carcinoma    Broken arm    right   [facility]) 2007   Descending colon, T3, N0. Adjuvant chemotherapy.   Depression    Diverticulitis    Hypertension    Hypertensive chronic kidney disease    Hypothyroidism    Menopausal  state    Renal failure May 2015   Dr Candiss Norse    Surgical History:  Past Surgical History:  Procedure Laterality Date   CATARACT EXTRACTION Bilateral    colon resection  2007   COLONOSCOPY  2008, 2011   Dr Bary Castilla, hyperplastic rectal polyps identified May 2008.   COLONOSCOPY WITH PROPOFOL N/A 12/14/2014   Procedure: COLONOSCOPY WITH PROPOFOL;  Surgeon: Robert Bellow, MD;  Location[facility] ENDOSCOPY;  Service: Endoscopy;  Laterality: N/A;   PORT-A-CATH REMOVAL  2009   PORTACATH PLACEMENT  2007    Medications:  Current Outpatient Medications on File Prior to Visit  Medication Sig    aspirin 81 MG tablet Take 81 mg by mouth daily.   Docusate Calcium (STOOL SOFTENER PO) Take 1 capsule by mouth daily.   levothyroxine (SYNTHROID) 75 MCG tablet Take 1 tablet (75 mcg total) by mouth daily.   lisinopril (ZESTRIL) 10 MG tablet TAKE 1 TABLET(10 MG) BY MOUTH DAILY   Melatonin 5 MG TABS Take 1 tablet by mouth daily.   raloxifene (EVISTA) 60 MG tablet Take 1 tablet (60 mg total) by mouth daily.   No current facility-administered medications on file prior to visit.    Allergies:  No Known Allergies  Social History:  Social History   Socioeconomic History   Marital status: Single    Spouse name: Not on file   Number of children: 0   Years of education: 1 year college    Highest education level: Some college, no degree  Occupational History   Occupation: retired  Tobacco Use   Smoking status: Never Smoker   Smokeless tobacco: Never Used  Scientific laboratory technician Use: Never used  Substance and Sexual Activity   Alcohol use: No    Alcohol/week: 0.0 standard drinks   Drug use: No   Sexual activity: Never  Other Topics Concern   Not on file  Social History Narrative   Works at [employer] as Psychologist, 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    Social Determinants of Radio broadcast assistant Strain:    Difficulty of Paying Living Expenses:   Food Insecurity:    Worried About Charity fundraiser in the Last Year:    Arboriculturist in the Last Year:   Transportation Needs:    Film/video editor (Medical):    Lack of Transportation (Non-Medical):   Physical Activity:    Days of Exercise per Week:    Minutes of Exercise per Session:   Stress:    Feeling of Stress :   Social Connections:    Frequency of Communication with Friends and Family:    Frequency of Social Gatherings with Friends and Family:    Attends Religious Services:    Active Member of Clubs or Organizations:    Attends Music therapist:    Marital Status:    Intimate Partner Violence:    Fear of Current or Ex-Partner:    Emotionally Abused:    Physically Abused:    Sexually Abused:    Social History   Tobacco Use  Smoking Status Never Smoker  Smokeless Tobacco Never Used   Social History   Substance and Sexual Activity  Alcohol Use No   Alcohol/week: 0.0 standard drinks    Family History:  Family History  Problem Relation Age of Onset   Heart disease Mother    Congestive Heart Failure Mother    Hypertension Mother    Leukemia Father    Tremor  Brother        x 2, 1 deceased   Tremor Maternal Grandfather     Past medical history, surgical history, medications, allergies, family history and social history reviewed with patient today and changes made to appropriate areas of the chart.   Review of Systems - negative All other ROS negative except what is listed above and in the HPI.      Objective:    BP 109/69    Pulse 68    Temp 98.2 F (36.8 C) (Oral)    Ht 5' 4.5" (1.638 m)    Wt 148 lb 9.6 oz (67.4 kg)    LMP  (LMP Unknown)    SpO2 97%    BMI 25.11 kg/m   Wt Readings from Last 3 Encounters:  12/07/19 148 lb 9.6 oz (67.4 kg)  08/26/18 145 lb (65.8 kg)  04/06/18 145 lb (65.8 kg)    Physical Exam Constitutional:      General: She is awake. She is not in acute distress.    Appearance: She is well-developed. She is not ill-appearing.  HENT:     Head: Normocephalic and atraumatic.     Right Ear: Hearing, tympanic membrane, ear canal and external ear normal. No drainage.     Left Ear: Hearing, tympanic membrane, ear canal and external ear normal. No drainage.     Nose: Nose normal.     Right Sinus: No maxillary sinus tenderness or frontal sinus tenderness.     Left Sinus: No maxillary sinus tenderness or frontal sinus tenderness.     Mouth/Throat:     Mouth: Mucous membranes are moist.     Pharynx: Oropharynx is clear. Uvula midline. No pharyngeal swelling, oropharyngeal exudate or posterior oropharyngeal  erythema.  Eyes:     General: Lids are normal.        Right eye: No discharge.        Left eye: No discharge.     Extraocular Movements: Extraocular movements intact.     Conjunctiva/sclera: Conjunctivae normal.     Pupils: Pupils are equal, round, and reactive to light.     Visual Fields: Right eye visual fields normal and left eye visual fields normal.  Neck:     Thyroid: No thyromegaly.     Vascular: No carotid bruit.     Trachea: Trachea normal.  Cardiovascular:     Rate and Rhythm: Normal rate and regular rhythm.     Heart sounds: Normal heart sounds. No murmur heard.  No gallop.   Pulmonary:     Effort: Pulmonary effort is normal. No accessory muscle usage or respiratory distress.     Breath sounds: Normal breath sounds.  Abdominal:     General: Bowel sounds are normal.     Palpations: Abdomen is soft. There is no hepatomegaly or splenomegaly.     Tenderness: There is no abdominal tenderness.  Musculoskeletal:        General: Normal range of motion.     Cervical back: Normal range of motion and neck supple.     Right lower leg: No edema.     Left lower leg: No edema.  Lymphadenopathy:     Head:     Right side of head: No submental, submandibular, tonsillar, preauricular or posterior auricular adenopathy.     Left side of head: No submental, submandibular, tonsillar, preauricular or posterior auricular adenopathy.     Cervical: No cervical adenopathy.  Skin:    General: Skin is warm and dry.  Capillary Refill: Capillary refill takes less than 2 seconds.     Findings: No rash.  Neurological:     Mental Status: She is alert and oriented to person, place, and time.     Cranial Nerves: Cranial nerves are intact.     Gait: Gait is intact.     Deep Tendon Reflexes: Reflexes are normal and symmetric.     Reflex Scores:      Brachioradialis reflexes are 2+ on the right side and 2+ on the left side.      Patellar reflexes are 2+ on the right side and 2+ on the left  side. Psychiatric:        Attention and Perception: Attention normal.        Mood and Affect: Mood normal.        Speech: Speech normal.        Behavior: Behavior normal. Behavior is cooperative.        Thought Content: Thought content normal.        Judgment: Judgment normal.     Results for orders placed or performed in visit on 10/13/18  CBC with Differential/Platelet  Result Value Ref Range   WBC 8.8 3.4 - 10.8 x10E3/uL   RBC 3.74 (L) 3.77 - 5.28 x10E6/uL   Hemoglobin 11.8 11.1 - 15.9 g/dL   MCV 94 79 - 97 fL   MCH 31.6 26.6 - 33.0 pg   MCHC 33.4 31 - 35 g/dL   RDW 12.4 11.7 - 15.4 %   Platelets 175 150 - 450 x10E3/uL   Neutrophils 68 Not Estab. %   Lymphs 19 Not Estab. %   Monocytes 10 Not Estab. %   Eos 2 Not Estab. %   Basos 1 Not Estab. %   Neutrophils Absolute 6.0 1 - 7 x10E3/uL   Lymphocytes Absolute 1.7 0 - 3 x10E3/uL   Monocytes Absolute 0.9 0 - 0 x10E3/uL   EOS (ABSOLUTE) 0.2 0.0 - 0.4 x10E3/uL   Basophils Absolute 0.1 0 - 0 x10E3/uL   Immature Granulocytes 0 Not Estab. %   Immature Grans (Abs) 0.0 0.0 - 0.1 x10E3/uL  Anemia panel  Result Value Ref Range   Total Iron Binding Capacity 258 250 - 450 ug/dL   UIBC 177 118 - 369 ug/dL   Iron 81 27 - 139 ug/dL   Iron Saturation 31 15 - 55 %   Vitamin B-12 578 232 - 1,245 pg/mL   Folate, Hemolysate 415.0 Not Estab. ng/mL   Hematocrit 35.3 34.0 - 46.6 %   Folate, RBC 1,176 >498 ng/mL   Ferritin 140 15.0 - 150.0 ng/mL   Retic Ct Pct 0.9 0.6 - 2.6 %  Comprehensive metabolic panel  Result Value Ref Range   Glucose 106 (H) 65 - 99 mg/dL   BUN 24 8 - 27 mg/dL   Creatinine, Ser 1.36 (H) 0.57 - 1.00 mg/dL   GFR calc non Af Amer 36 (L) >59 mL/min/1.73   GFR calc Af Amer 41 (L) >59 mL/min/1.73   BUN/Creatinine Ratio 18 12 - 28   Sodium 139 134 - 144 mmol/L   Potassium 5.1 3.5 - 5.2 mmol/L   Chloride 104 96 - 106 mmol/L   CO2 21 20 - 29 mmol/L   Calcium 9.4 8.7 - 10.3 mg/dL   Total Protein 7.3 6.0 - 8.5 g/dL    Albumin 4.5 3.6 - 4.6 g/dL   Globulin, Total 2.8 1.5 - 4.5 g/dL   Albumin/Globulin Ratio 1.6 1.2 - 2.2  Bilirubin Total 0.3 0.0 - 1.2 mg/dL   Alkaline Phosphatase 63 39 - 117 IU/L   AST 18 0 - 40 IU/L   ALT 11 0 - 32 IU/L  Thyroid Panel With TSH  Result Value Ref Range   TSH 2.270 0.450 - 4.500 uIU/mL   T4, Total 10.0 4.5 - 12.0 ug/dL   T3 Uptake Ratio 25 24 - 39 %   Free Thyroxine Index 2.5 1.2 - 4.9      Assessment & Plan:   Problem List Items Addressed This Visit      Endocrine   Hypothyroidism    Chronic, ongoing.  Continue current medication regimen and adjust as needed based on TSH.  Labs today.  Return in 6 months.      Relevant Orders   T4, free     Musculoskeletal and Integument   Osteoporosis    Chronic, ongoing.  Continue Evista.  Patient declines a repeat DEXA at this time.  Vit D level today.      Relevant Orders   VITAMIN D 25 Hydroxy (Vit-D Deficiency, Fractures)     Genitourinary   Hypertensive chronic kidney disease    Chronic, ongoing.  At goal in office today and at goal for age at home, avoid hypotension.  Continue current medication regimen and collaboration with nephrology.  CMP and CBC today.  Recommend continue to monitor BP at home regularly, focus on DASH diet, and avoid nephrotoxic medications.  Return in 6 months.      Relevant Orders   CBC with Differential/Platelet   Comprehensive metabolic panel   TSH   CKD (chronic kidney disease) stage 3, GFR 30-59 ml/min    Chronic, ongoing.  Continue collaboration with nephrology.  CMP today.      Relevant Orders   Comprehensive metabolic panel    Other Visit Diagnoses    Routine general medical examination at a health care facility    -  Primary   Annual labs today to include CBC, CMP, TSH.  Does not wish lipid check.       Follow up plan: Return in about 6 months (around 06/08/2020) for HTN, CKD, Thyroid.   LABORATORY TESTING:  - Pap smear: not applicable  IMMUNIZATIONS:   - Tdap:  Tetanus vaccination status reviewed: refuses - Influenza: Up to date - Pneumovax: Up to date - Prevnar: Up to date - HPV: Not applicable - Zostavax vaccine: refused  SCREENING: -Mammogram: Not applicable  - Colonoscopy: Not applicable  - Bone Density: Ordered today  -Hearing Test: Not applicable  -Spirometry: Not applicable   PATIENT COUNSELING:   Advised to take 1 mg of folate supplement per day if capable of pregnancy.   Sexuality: Discussed sexually transmitted diseases, partner selection, use of condoms, avoidance of unintended pregnancy  and contraceptive alternatives.   Advised to avoid cigarette smoking.  I discussed with the patient that most people either abstain from alcohol or drink within safe limits (<=14/week and <=4 drinks/occasion for males, <=7/weeks and <= 3 drinks/occasion for females) and that the risk for alcohol disorders and other health effects rises proportionally with the number of drinks per week and how often a drinker exceeds daily limits.  Discussed cessation/primary prevention of drug use and availability of treatment for abuse.   Diet: Encouraged to adjust caloric intake to maintain  or achieve ideal body weight, to reduce intake of dietary saturated fat and total fat, to limit sodium intake by avoiding high sodium foods and not adding table salt, and  to maintain adequate dietary potassium and calcium preferably from fresh fruits, vegetables, and low-fat dairy products.    stressed the importance of regular exercise  Injury prevention: Discussed safety belts, safety helmets, smoke detector, smoking near bedding or upholstery.   Dental health: Discussed importance of regular tooth brushing, flossing, and dental visits.    NEXT PREVENTATIVE PHYSICAL DUE IN 1 YEAR. Return in about 6 months (around 06/08/2020) for HTN, CKD, Thyroid.

## 2019-12-07 NOTE — Patient Instructions (Signed)
Healthy Eating Following a healthy eating pattern may help you to achieve and maintain a healthy body weight, reduce the risk of chronic disease, and live a long and productive life. It is important to follow a healthy eating pattern at an appropriate calorie level for your body. Your nutritional needs should be met primarily through food by choosing a variety of nutrient-rich foods. What are tips for following this plan? Reading food labels  Read labels and choose the following: ? Reduced or low sodium. ? Juices with 100% fruit juice. ? Foods with low saturated fats and high polyunsaturated and monounsaturated fats. ? Foods with whole grains, such as whole wheat, cracked wheat, brown rice, and wild rice. ? Whole grains that are fortified with folic acid. This is recommended for women who are pregnant or who want to become pregnant.  Read labels and avoid the following: ? Foods with a lot of added sugars. These include foods that contain brown sugar, corn sweetener, corn syrup, dextrose, fructose, glucose, high-fructose corn syrup, honey, invert sugar, lactose, malt syrup, maltose, molasses, raw sugar, sucrose, trehalose, or turbinado sugar.  Do not eat more than the following amounts of added sugar per day:  6 teaspoons (25 g) for women.  9 teaspoons (38 g) for men. ? Foods that contain processed or refined starches and grains. ? Refined grain products, such as white flour, degermed cornmeal, white bread, and white rice. Shopping  Choose nutrient-rich snacks, such as vegetables, whole fruits, and nuts. Avoid high-calorie and high-sugar snacks, such as potato chips, fruit snacks, and candy.  Use oil-based dressings and spreads on foods instead of solid fats such as butter, stick margarine, or cream cheese.  Limit pre-made sauces, mixes, and "instant" products such as flavored rice, instant noodles, and ready-made pasta.  Try more plant-protein sources, such as tofu, tempeh, black beans,  edamame, lentils, nuts, and seeds.  Explore eating plans such as the Mediterranean diet or vegetarian diet. Cooking  Use oil to saut or stir-fry foods instead of solid fats such as butter, stick margarine, or lard.  Try baking, boiling, grilling, or broiling instead of frying.  Remove the fatty part of meats before cooking.  Steam vegetables in water or broth. Meal planning   At meals, imagine dividing your plate into fourths: ? One-half of your plate is fruits and vegetables. ? One-fourth of your plate is whole grains. ? One-fourth of your plate is protein, especially lean meats, poultry, eggs, tofu, beans, or nuts.  Include low-fat dairy as part of your daily diet. Lifestyle  Choose healthy options in all settings, including home, work, school, restaurants, or stores.  Prepare your food safely: ? Wash your hands after handling raw meats. ? Keep food preparation surfaces clean by regularly washing with hot, soapy water. ? Keep raw meats separate from ready-to-eat foods, such as fruits and vegetables. ? Cook seafood, meat, poultry, and eggs to the recommended internal temperature. ? Store foods at safe temperatures. In general:  Keep cold foods at 59F (4.4C) or below.  Keep hot foods at 159F (60C) or above.  Keep your freezer at [facility] (-17.8C) or below.  Foods are no longer safe to eat when they have been between the temperatures of 40-159F (4.4-60C) for more than 2 hours. What foods should I eat? Fruits Aim to eat 2 cup-equivalents of fresh, canned (in natural juice), or frozen fruits each day. Examples of 1 cup-equivalent of fruit include 1 small apple, 8 large strawberries, 1 cup canned fruit,  cup  dried fruit, or 1 cup 100% juice. Vegetables Aim to eat 2-3 cup-equivalents of fresh and frozen vegetables each day, including different varieties and colors. Examples of 1 cup-equivalent of vegetables include 2 medium carrots, 2 cups raw, leafy greens, 1 cup chopped  vegetable (raw or cooked), or 1 medium baked potato. Grains Aim to eat 6 ounce-equivalents of whole grains each day. Examples of 1 ounce-equivalent of grains include 1 slice of bread, 1 cup ready-to-eat cereal, 3 cups popcorn, or  cup cooked rice, pasta, or cereal. Meats and other proteins Aim to eat 5-6 ounce-equivalents of protein each day. Examples of 1 ounce-equivalent of protein include 1 egg, 1/2 cup nuts or seeds, or 1 tablespoon (16 g) peanut butter. A cut of meat or fish that is the size of a deck of cards is about 3-4 ounce-equivalents.  Of the protein you eat each week, try to have at least 8 ounces come from seafood. This includes salmon, trout, herring, and anchovies. Dairy Aim to eat 3 cup-equivalents of fat-free or low-fat dairy each day. Examples of 1 cup-equivalent of dairy include 1 cup (240 mL) milk, 8 ounces (250 g) yogurt, 1 ounces (44 g) natural cheese, or 1 cup (240 mL) fortified soy milk. Fats and oils  Aim for about 5 teaspoons (21 g) per day. Choose monounsaturated fats, such as canola and olive oils, avocados, peanut butter, and most nuts, or polyunsaturated fats, such as sunflower, corn, and soybean oils, walnuts, pine nuts, sesame seeds, sunflower seeds, and flaxseed. Beverages  Aim for six 8-oz glasses of water per day. Limit coffee to three to five 8-oz cups per day.  Limit caffeinated beverages that have added calories, such as soda and energy drinks.  Limit alcohol intake to no more than 1 drink a day for nonpregnant women and 2 drinks a day for men. One drink equals 12 oz of beer (355 mL), 5 oz of wine (148 mL), or 1 oz of hard liquor (44 mL). Seasoning and other foods  Avoid adding excess amounts of salt to your foods. Try flavoring foods with herbs and spices instead of salt.  Avoid adding sugar to foods.  Try using oil-based dressings, sauces, and spreads instead of solid fats. This information is based on general [place] nutrition guidelines. For more  information, visit [employer]es. Exact amounts may vary based on your nutrition needs. Summary  A healthy eating plan may help you to maintain a healthy weight, reduce the risk of chronic diseases, and stay active throughout your life.  Plan your meals. Make sure you eat the right portions of a variety of nutrient-rich foods.  Try baking, boiling, grilling, or broiling instead of frying.  Choose healthy options in all settings, including home, work, school, restaurants, or stores. This information is not intended to replace advice given to you by your health care provider. Make sure you discuss any questions you have with your health care provider. Document Revised: 07/28/2017 Document Reviewed: 07/28/2017 Elsevier Patient Education  [place].

## 2019-12-07 NOTE — Assessment & Plan Note (Signed)
Chronic, ongoing.  Continue current medication regimen and adjust as needed based on TSH.  Labs today.  Return in 6 months.

## 2019-12-08 ENCOUNTER — Other Ambulatory Visit: Payer: Self-pay | Admitting: Nurse Practitioner

## 2019-12-08 DIAGNOSIS — D72820 Lymphocytosis (symptomatic): Secondary | ICD-10-CM

## 2019-12-08 DIAGNOSIS — E871 Hypo-osmolality and hyponatremia: Secondary | ICD-10-CM

## 2019-12-08 LAB — COMPREHENSIVE METABOLIC PANEL
ALT: 12 IU/L (ref 0–32)
AST: 19 IU/L (ref 0–40)
Albumin/Globulin Ratio: 1.6 (ref 1.2–2.2)
Albumin: 4.1 g/dL (ref 3.6–4.6)
Alkaline Phosphatase: 71 IU/L (ref 48–121)
BUN/Creatinine Ratio: 21 (ref 12–28)
BUN: 30 mg/dL — ABNORMAL HIGH (ref 8–27)
Bilirubin Total: 0.4 mg/dL (ref 0.0–1.2)
CO2: 18 mmol/L — ABNORMAL LOW (ref 20–29)
Calcium: 8.4 mg/dL — ABNORMAL LOW (ref 8.7–10.3)
Chloride: 97 mmol/L (ref 96–106)
Creatinine, Ser: 1.44 mg/dL — ABNORMAL HIGH (ref 0.57–1.00)
GFR calc Af Amer: 38 mL/min/{1.73_m2} — ABNORMAL LOW (ref >59)
GFR calc non Af Amer: 33 mL/min/{1.73_m2} — ABNORMAL LOW (ref >59)
Globulin, Total: 2.6 g/dL (ref 1.5–4.5)
Glucose: 73 mg/dL (ref 65–99)
Potassium: 5.1 mmol/L (ref 3.5–5.2)
Sodium: 131 mmol/L — ABNORMAL LOW (ref 134–144)
Total Protein: 6.7 g/dL (ref 6.0–8.5)

## 2019-12-08 LAB — CBC WITH DIFFERENTIAL/PLATELET
Basophils Absolute: 0.1 10*3/uL (ref 0.0–0.2)
Basos: 1 %
EOS (ABSOLUTE): 0.2 10*3/uL (ref 0.0–0.4)
Eos: 1 %
Hematocrit: 35.3 % (ref 34.0–46.6)
Hemoglobin: 11.5 g/dL (ref 11.1–15.9)
Immature Grans (Abs): 0.1 10*3/uL (ref 0.0–0.1)
Immature Granulocytes: 1 %
Lymphocytes Absolute: 2.2 10*3/uL (ref 0.7–3.1)
Lymphs: 19 %
MCH: 31.3 pg (ref 26.6–33.0)
MCHC: 32.6 g/dL (ref 31.5–35.7)
MCV: 96 fL (ref 79–97)
Monocytes Absolute: 1.1 10*3/uL — ABNORMAL HIGH (ref 0.1–0.9)
Monocytes: 10 %
Neutrophils Absolute: 7.9 10*3/uL — ABNORMAL HIGH (ref 1.4–7.0)
Neutrophils: 68 %
Platelets: 145 10*3/uL — ABNORMAL LOW (ref 150–450)
RBC: 3.67 x10E6/uL — ABNORMAL LOW (ref 3.77–5.28)
RDW: 12.3 % (ref 11.7–15.4)
WBC: 11.5 10*3/uL — ABNORMAL HIGH (ref 3.4–10.8)

## 2019-12-08 LAB — VITAMIN D 25 HYDROXY (VIT D DEFICIENCY, FRACTURES): Vit D, 25-Hydroxy: 21.5 ng/mL — ABNORMAL LOW (ref 30.0–100.0)

## 2019-12-08 LAB — TSH: TSH: 1.92 u[IU]/mL (ref 0.450–4.500)

## 2019-12-08 LAB — T4, FREE: Free T4: 1.6 ng/dL (ref 0.82–1.77)

## 2019-12-08 NOTE — Progress Notes (Signed)
Please let Sara Cannon know her labs have returned.  Her white blood cell count was a little elevated and platelets a little low.  Some of this may be related to her recent steroid shot, I would like to recheck these in 4 weeks to see if continues or is worsening, please schedule lab visit only for 4 weeks.  Kidney function continues at baseline, sodium (salt) was a little low.  Add just a little salt to diet daily, not too much, and we will recheck in 4 weeks.  Thyroid labs remain stable and can continue current dose Levothyroxine.  Vitamin D is a little low, recommend she take Vitamin D3 1000 units daily which is good for her bones.  Any questions?  Have a wonderful day!!

## 2020-01-07 ENCOUNTER — Other Ambulatory Visit: Payer: PPO

## 2020-01-07 ENCOUNTER — Other Ambulatory Visit: Payer: Self-pay

## 2020-01-07 DIAGNOSIS — E871 Hypo-osmolality and hyponatremia: Secondary | ICD-10-CM

## 2020-01-07 DIAGNOSIS — D72820 Lymphocytosis (symptomatic): Secondary | ICD-10-CM | POA: Diagnosis not present

## 2020-01-07 LAB — UA/M W/RFLX CULTURE, ROUTINE
Bilirubin, UA: NEGATIVE
Glucose, UA: NEGATIVE
Ketones, UA: NEGATIVE
Leukocytes,UA: NEGATIVE
Nitrite, UA: NEGATIVE
Protein,UA: NEGATIVE
Specific Gravity, UA: 1.01 (ref 1.005–1.030)
Urobilinogen, Ur: 0.2 mg/dL (ref 0.2–1.0)
pH, UA: 5 (ref 5.0–7.5)

## 2020-01-07 LAB — MICROSCOPIC EXAMINATION: Bacteria, UA: NONE SEEN

## 2020-01-08 LAB — CBC WITH DIFFERENTIAL/PLATELET
Basophils Absolute: 0.1 10*3/uL (ref 0.0–0.2)
Basos: 1 %
EOS (ABSOLUTE): 0.1 10*3/uL (ref 0.0–0.4)
Eos: 1 %
Hematocrit: 34.5 % (ref 34.0–46.6)
Hemoglobin: 11.6 g/dL (ref 11.1–15.9)
Immature Grans (Abs): 0 10*3/uL (ref 0.0–0.1)
Immature Granulocytes: 0 %
Lymphocytes Absolute: 1.9 10*3/uL (ref 0.7–3.1)
Lymphs: 18 %
MCH: 32.4 pg (ref 26.6–33.0)
MCHC: 33.6 g/dL (ref 31.5–35.7)
MCV: 96 fL (ref 79–97)
Monocytes Absolute: 0.9 10*3/uL (ref 0.1–0.9)
Monocytes: 9 %
Neutrophils Absolute: 7.4 10*3/uL — ABNORMAL HIGH (ref 1.4–7.0)
Neutrophils: 71 %
Platelets: 191 10*3/uL (ref 150–450)
RBC: 3.58 x10E6/uL — ABNORMAL LOW (ref 3.77–5.28)
RDW: 12.4 % (ref 11.7–15.4)
WBC: 10.3 10*3/uL (ref 3.4–10.8)

## 2020-01-08 LAB — SODIUM: Sodium: 139 mmol/L (ref 134–144)

## 2020-01-09 ENCOUNTER — Other Ambulatory Visit: Payer: Self-pay | Admitting: Nurse Practitioner

## 2020-01-09 DIAGNOSIS — R319 Hematuria, unspecified: Secondary | ICD-10-CM

## 2020-01-09 NOTE — Progress Notes (Signed)
Please let Magaline know her labs have returned and white blood cell and platelets have returned to normal.  Neutrophils are trending down too.  Sodium level is now normal and improved.  Urine did show some blood in it, I would like her to provide another urine sample in 4 weeks to recheck this.  If ongoing blood in urine I may send her to urology.  Please continue to drink a lot of fluid.  If any questions let me know.

## 2020-02-04 ENCOUNTER — Other Ambulatory Visit: Payer: Self-pay

## 2020-02-11 ENCOUNTER — Other Ambulatory Visit: Payer: PPO

## 2020-02-11 ENCOUNTER — Other Ambulatory Visit: Payer: Self-pay

## 2020-02-11 DIAGNOSIS — R319 Hematuria, unspecified: Secondary | ICD-10-CM | POA: Diagnosis not present

## 2020-02-11 LAB — UA/M W/RFLX CULTURE, ROUTINE
Bilirubin, UA: NEGATIVE
Glucose, UA: NEGATIVE
Ketones, UA: NEGATIVE
Leukocytes,UA: NEGATIVE
Nitrite, UA: NEGATIVE
Protein,UA: NEGATIVE
Specific Gravity, UA: <1.005 — ABNORMAL LOW (ref 1.005–1.030)
Urobilinogen, Ur: 0.2 mg/dL (ref 0.2–1.0)
pH, UA: 6 (ref 5.0–7.5)

## 2020-02-11 LAB — MICROSCOPIC EXAMINATION
Bacteria, UA: NONE SEEN
WBC, UA: NONE SEEN /hpf (ref 0–5)

## 2020-02-13 ENCOUNTER — Other Ambulatory Visit: Payer: Self-pay | Admitting: Nurse Practitioner

## 2020-02-13 DIAGNOSIS — R3121 Asymptomatic microscopic hematuria: Secondary | ICD-10-CM

## 2020-02-13 NOTE — Progress Notes (Signed)
Please let Sara Cannon know her urine is showing less blood on this check, was previously 2+ and now just trace.  I want her to continue drinking plenty of water and would like to recheck this on outpatient labs in 6 weeks please.  Schedule visit for labs only.  Would like to make sure this continues to improve.  Any questions? Keep being awesome!!  Thank you for allowing me to participate in your care. Kindest regards, Gerhard Rappaport

## 2020-03-09 DIAGNOSIS — N1832 Chronic kidney disease, stage 3b: Secondary | ICD-10-CM | POA: Diagnosis not present

## 2020-03-16 DIAGNOSIS — N1832 Chronic kidney disease, stage 3b: Secondary | ICD-10-CM | POA: Diagnosis not present

## 2020-03-16 DIAGNOSIS — N261 Atrophy of kidney (terminal): Secondary | ICD-10-CM | POA: Diagnosis not present

## 2020-03-16 DIAGNOSIS — E875 Hyperkalemia: Secondary | ICD-10-CM | POA: Diagnosis not present

## 2020-03-16 DIAGNOSIS — I10 Essential (primary) hypertension: Secondary | ICD-10-CM | POA: Diagnosis not present

## 2020-03-30 DIAGNOSIS — E875 Hyperkalemia: Secondary | ICD-10-CM | POA: Diagnosis not present

## 2020-05-04 DIAGNOSIS — N1832 Chronic kidney disease, stage 3b: Secondary | ICD-10-CM | POA: Diagnosis not present

## 2020-05-04 DIAGNOSIS — E875 Hyperkalemia: Secondary | ICD-10-CM | POA: Diagnosis not present

## 2020-06-13 ENCOUNTER — Other Ambulatory Visit: Payer: Self-pay

## 2020-06-13 ENCOUNTER — Encounter: Payer: Self-pay | Admitting: Nurse Practitioner

## 2020-06-13 ENCOUNTER — Ambulatory Visit (INDEPENDENT_AMBULATORY_CARE_PROVIDER_SITE_OTHER): Payer: PPO | Admitting: Nurse Practitioner

## 2020-06-13 VITALS — BP 131/75 | HR 68 | Temp 97.7°F | Ht 64.57 in | Wt 150.4 lb

## 2020-06-13 DIAGNOSIS — E039 Hypothyroidism, unspecified: Secondary | ICD-10-CM

## 2020-06-13 DIAGNOSIS — N1832 Chronic kidney disease, stage 3b: Secondary | ICD-10-CM | POA: Diagnosis not present

## 2020-06-13 DIAGNOSIS — I129 Hypertensive chronic kidney disease with stage 1 through stage 4 chronic kidney disease, or unspecified chronic kidney disease: Secondary | ICD-10-CM

## 2020-06-13 DIAGNOSIS — M810 Age-related osteoporosis without current pathological fracture: Secondary | ICD-10-CM

## 2020-06-13 MED ORDER — LISINOPRIL 10 MG PO TABS
ORAL_TABLET | ORAL | 4 refills | Status: DC
Start: 2020-06-13 — End: 2021-06-19

## 2020-06-13 MED ORDER — LEVOTHYROXINE SODIUM 75 MCG PO TABS
75.0000 ug | ORAL_TABLET | Freq: Every day | ORAL | 4 refills | Status: DC
Start: 2020-06-13 — End: 2021-06-19

## 2020-06-13 MED ORDER — RALOXIFENE HCL 60 MG PO TABS
60.0000 mg | ORAL_TABLET | Freq: Every day | ORAL | 4 refills | Status: DC
Start: 2020-06-13 — End: 2021-06-19

## 2020-06-13 NOTE — Assessment & Plan Note (Signed)
Chronic, ongoing.  At goal in office today, avoid hypotension.  Continue current medication regimen and collaboration with nephrology.  CMP and CBC up to date with nephrology in January.  Recommend continue to monitor BP at home regularly, focus on DASH diet, and avoid nephrotoxic medications.  Return in 6 months.

## 2020-06-13 NOTE — Patient Instructions (Signed)
Chronic Kidney Disease, Adult Chronic kidney disease is when lasting damage happens to the kidneys slowly over a long time. The kidneys help to:  Make pee (urine).  Make hormones.  Keep the right amount of fluids and chemicals in the body. Most often, this disease does not go away. You must take steps to help keep the kidney damage from getting worse. If steps are not taken, the kidneys might stop working forever. What are the causes?  Diabetes.  High blood pressure.  Diseases that affect the heart and blood vessels.  Other kidney diseases.  Diseases of the body's disease-fighting system.  A problem with the flow of pee.  Infections of the organs that make pee, store it, and take it out of the body.  Swelling or irritation of your blood vessels. What increases the risk?  Getting older.  Having someone in your family who has kidney disease or kidney failure.  Having a disease caused by genes.  Taking medicines often that harm the kidneys.  Being near or having contact with harmful substances.  Being very overweight.  Using tobacco now or in the past. What are the signs or symptoms?  Feeling very tired.  Having a swollen face, legs, ankles, or feet.  Feeling like you may vomit or vomiting.  Not feeling hungry.  Being confused or not able to focus.  Twitches and cramps in the leg muscles or other muscles.  Dry, itchy skin.  A taste of metal in your mouth.  Making less pee, or making more pee.  Shortness of breath.  Trouble sleeping. You may also become anemic or get weak bones. Anemic means there is not enough red blood cells or hemoglobin in your blood. You may get symptoms slowly. You may not notice them until the kidney damage gets very bad. How is this treated? Often, there is no cure for this disease. Treatment can help with symptoms and help keep the disease from getting worse. You may need to:  Avoid alcohol.  Avoid foods that are high in  salt, potassium, phosphorous, and protein.  Take medicines for symptoms and to help control other conditions.  Have dialysis. This treatment gets harmful waste out of your body.  Treat other problems that cause your kidney disease or make it worse. Follow these instructions at home: Medicines  Take over-the-counter and prescription medicines only as told by your doctor.  Do not take any new medicines, vitamins, or supplements unless your doctor says it is okay. Lifestyle  Do not smoke or use any products that contain nicotine or tobacco. If you need help quitting, ask your doctor.  If you drink alcohol: ? Limit how much you use to:  0-1 drink a day for women who are not pregnant.  0-2 drinks a day for men. ? Know how much alcohol is in your drink. In the [place] one drink equals one 12 oz bottle of beer (355 mL), one 5 oz glass of wine (148 mL), or one 1 oz glass of hard liquor (44 mL).  Stay at a healthy weight. If you need help losing weight, ask your doctor.   General instructions  Follow instructions from your doctor about what you cannot eat or drink.  Track your blood pressure at home. Tell your doctor about any changes.  If you have diabetes, track your blood sugar.  Exercise at least 30 minutes a day, 5 days a week.  Keep your shots (vaccinations) up to date.  Keep all follow-up visits.     Where to find more information  American Association of Kidney Patients: www.aakp.org  National Kidney Foundation: www.kidney.org  American Kidney Fund: www.akfinc.org  Life Options: www.lifeoptions.org  Kidney School: www.kidneyschool.org Contact a doctor if:  Your symptoms get worse.  You get new symptoms. Get help right away if:  You get symptoms of end-stage kidney disease. These include: ? Headaches. ? Losing feeling in your hands or feet. ? Easy bruising. ? Having hiccups often. ? Chest pain. ? Shortness of breath. ? Lack of menstrual periods, in  women.  You have a fever.  You make less pee than normal.  You have pain or you bleed when you pee or poop. These symptoms may be an emergency. Get help right away. Call your local emergency services (911 in the [place]  Do not wait to see if the symptoms will go away.  Do not drive yourself to the hospital. Summary  Chronic kidney disease is when lasting damage happens to the kidneys slowly over a long time.  Causes of this disease include diabetes and high blood pressure.  Often, there is no cure for this disease. Treatment can help symptoms and help keep the disease from getting worse.  Treatment may involve lifestyle changes, medicines, and dialysis. This information is not intended to replace advice given to you by your health care provider. Make sure you discuss any questions you have with your health care provider. Document Revised: 07/21/2019 Document Reviewed: 07/21/2019 Elsevier Patient Education  2021 Elsevier Inc.  

## 2020-06-13 NOTE — Assessment & Plan Note (Signed)
Chronic, ongoing.  Continue collaboration with nephrology.  Labs up to date and reviewed, with nephrology in January.

## 2020-06-13 NOTE — Progress Notes (Signed)
BP 131/75   Pulse 68   Temp 97.7 F (36.5 C) (Oral)   Ht 5' 4.57" (1.64 m)   Wt 150 lb 6.4 oz (68.2 kg)   LMP  (LMP Unknown)   SpO2 99%   BMI 25.36 kg/m    Subjective:    Patient ID: Sara Cannon, female    DOB: [birth year], 87 y.o.   MRN: [id number]  HPI: Sara Cannon is a 87 y.o. female  Chief Complaint  Patient presents with  . Hypertension  . thyroid  . Chronic Kidney Disease   HYPOTHYROIDISM Continues on Levothyroxine 75 MCG daily with last TSH in August 1.920 and Free T4 1.60. Thyroid control status:stable Satisfied with current treatment? yes Medication side effects: no Medication compliance: good compliance Etiology of hypothyroidism:  Recent dose adjustment:no Fatigue: no Cold intolerance: no Heat intolerance: no Weight gain: no Weight loss: no Constipation: no Diarrhea/loose stools: no Palpitations: no Lower extremity edema: no Anxiety/depressed mood: no   HYPERTENSION Continues on Lisinopril 10 MG daily. Hypertension status: stable  Satisfied with current treatment? yes Duration of hypertension: chronic BP monitoring frequency:  daily BP range:  BP medication side effects:  no Medication compliance: good compliance Previous BP meds: Aspirin: yes Recurrent headaches: no Visual changes: no Palpitations: no Dyspnea: no Chest pain: no Lower extremity edema: no Dizzy/lightheaded: no   CHRONIC KIDNEY DISEASE Had January labs (05/04/20) with CRT 1.52 and GFR 31, K+ 5.2, and NA+ 135, PTH 91 in November.  Sees Dr. Candiss Norse and last saw 03/16/20. CKD status: stable Medications renally dose: yes Previous renal evaluation: yes Pneumovax:  Up to Date Influenza Vaccine:  Up to Date  OSTEOPOROSIS Currently takes Evista.  Last DEXA in 2009, unable to pull results up on Epic. Last Vitamin D 21.5 in August 2021. Satisfied with current treatment?: yes Medication side effects: no Medication compliance: good compliance Past osteoporosis  medications/treatments: as above Adequate calcium & vitamin D: yes Intolerance to bisphosphonates:no Weight bearing exercises: yes   Relevant past medical, surgical, family and social history reviewed and updated as indicated. Interim medical history since our last visit reviewed. Allergies and medications reviewed and updated.  Review of Systems  Constitutional: Negative for activity change, appetite change, diaphoresis, fatigue and fever.  Respiratory: Negative for cough, chest tightness, shortness of breath and wheezing.   Cardiovascular: Negative for chest pain, palpitations and leg swelling.  Gastrointestinal: Negative.   Neurological: Negative.   Psychiatric/Behavioral: Negative.     Per HPI unless specifically indicated above     Objective:    BP 131/75   Pulse 68   Temp 97.7 F (36.5 C) (Oral)   Ht 5' 4.57" (1.64 m)   Wt 150 lb 6.4 oz (68.2 kg)   LMP  (LMP Unknown)   SpO2 99%   BMI 25.36 kg/m   Wt Readings from Last 3 Encounters:  06/13/20 150 lb 6.4 oz (68.2 kg)  12/07/19 148 lb 9.6 oz (67.4 kg)  08/26/18 145 lb (65.8 kg)    Physical Exam Vitals and nursing note reviewed.  Constitutional:      General: She is awake. She is not in acute distress.    Appearance: She is well-developed. She is not ill-appearing.  HENT:     Head: Normocephalic.     Right Ear: Hearing normal.     Left Ear: Hearing normal.     Nose: Nose normal.     Mouth/Throat:     Mouth: Mucous membranes are moist.  Eyes:  General: Lids are normal.        Right eye: No discharge.        Left eye: No discharge.     Conjunctiva/sclera: Conjunctivae normal.     Pupils: Pupils are equal, round, and reactive to light.  Neck:     Thyroid: No thyromegaly.     Vascular: No carotid bruit or JVD.  Cardiovascular:     Rate and Rhythm: Normal rate and regular rhythm.     Heart sounds: Normal heart sounds. No murmur heard. No gallop.   Pulmonary:     Effort: Pulmonary effort is normal. No  accessory muscle usage or respiratory distress.     Breath sounds: Normal breath sounds.  Abdominal:     General: Bowel sounds are normal.     Palpations: Abdomen is soft. There is no hepatomegaly or splenomegaly.  Musculoskeletal:     Cervical back: Normal range of motion and neck supple.     Right lower leg: No edema.     Left lower leg: No edema.  Lymphadenopathy:     Cervical: No cervical adenopathy.  Skin:    General: Skin is warm and dry.  Neurological:     Mental Status: She is alert and oriented to person, place, and time.  Psychiatric:        Attention and Perception: Attention normal.        Mood and Affect: Mood normal.        Behavior: Behavior normal. Behavior is cooperative.        Thought Content: Thought content normal.        Judgment: Judgment normal.     Results for orders placed or performed in visit on 02/11/20  Microscopic Examination   Urine  Result Value Ref Range   WBC, UA None seen 0 - 5 /hpf   RBC 0-2 0 - 2 /hpf   Epithelial Cells (non renal) 0-10 0 - 10 /hpf   Bacteria, UA None seen None seen/Few  UA/M w/rflx Culture, Routine   Specimen: Urine   Urine  Result Value Ref Range   Specific Gravity, UA <1.005 (L) 1.005 - 1.030   pH, UA 6.0 5.0 - 7.5   Color, UA Yellow Yellow   Appearance Ur Clear Clear   Leukocytes,UA Negative Negative   Protein,UA Negative Negative/Trace   Glucose, UA Negative Negative   Ketones, UA Negative Negative   RBC, UA Trace (A) Negative   Bilirubin, UA Negative Negative   Urobilinogen, Ur 0.2 0.2 - 1.0 mg/dL   Nitrite, UA Negative Negative   Microscopic Examination See below:       Assessment & Plan:   Problem List Items Addressed This Visit      Endocrine   Hypothyroidism    Chronic, ongoing.  Continue current medication regimen and adjust as needed based on TSH.  Labs next visit at physical.  Return in 6 months.      Relevant Medications   levothyroxine (SYNTHROID) 75 MCG tablet     Musculoskeletal and  Integument   Osteoporosis    Chronic, ongoing.  Continue Evista.  Patient declines a repeat DEXA at this time.  Vit D level next visit.      Relevant Medications   Cholecalciferol (D3) 50 MCG (2000 UT) TABS   raloxifene (EVISTA) 60 MG tablet     Genitourinary   Hypertensive chronic kidney disease - Primary    Chronic, ongoing.  At goal in office today, avoid hypotension.  Continue current medication  regimen and collaboration with nephrology.  CMP and CBC up to date with nephrology in January.  Recommend continue to monitor BP at home regularly, focus on DASH diet, and avoid nephrotoxic medications.  Return in 6 months.      CKD (chronic kidney disease) stage 3, GFR 30-59 ml/min (HCC)    Chronic, ongoing.  Continue collaboration with nephrology.  Labs up to date and reviewed, with nephrology in January.          Follow up plan: Return in about 6 months (around 12/11/2020) for Annual physical.

## 2020-06-13 NOTE — Assessment & Plan Note (Signed)
Chronic, ongoing.  Continue Evista.  Patient declines a repeat DEXA at this time.  Vit D level next visit.

## 2020-06-13 NOTE — Assessment & Plan Note (Signed)
Chronic, ongoing.  Continue current medication regimen and adjust as needed based on TSH.  Labs next visit at physical.  Return in 6 months.

## 2020-06-27 ENCOUNTER — Other Ambulatory Visit: Payer: Self-pay | Admitting: Nurse Practitioner

## 2020-06-29 DIAGNOSIS — E875 Hyperkalemia: Secondary | ICD-10-CM | POA: Diagnosis not present

## 2020-06-29 DIAGNOSIS — I129 Hypertensive chronic kidney disease with stage 1 through stage 4 chronic kidney disease, or unspecified chronic kidney disease: Secondary | ICD-10-CM | POA: Diagnosis not present

## 2020-06-29 DIAGNOSIS — I10 Essential (primary) hypertension: Secondary | ICD-10-CM | POA: Diagnosis not present

## 2020-06-29 DIAGNOSIS — N1832 Chronic kidney disease, stage 3b: Secondary | ICD-10-CM | POA: Diagnosis not present

## 2020-06-29 DIAGNOSIS — N261 Atrophy of kidney (terminal): Secondary | ICD-10-CM | POA: Diagnosis not present

## 2020-07-05 DIAGNOSIS — E875 Hyperkalemia: Secondary | ICD-10-CM | POA: Diagnosis not present

## 2020-07-05 DIAGNOSIS — N261 Atrophy of kidney (terminal): Secondary | ICD-10-CM | POA: Diagnosis not present

## 2020-07-05 DIAGNOSIS — I10 Essential (primary) hypertension: Secondary | ICD-10-CM | POA: Diagnosis not present

## 2020-07-05 DIAGNOSIS — N1832 Chronic kidney disease, stage 3b: Secondary | ICD-10-CM | POA: Diagnosis not present

## 2020-07-19 DIAGNOSIS — E875 Hyperkalemia: Secondary | ICD-10-CM | POA: Diagnosis not present

## 2020-07-21 DIAGNOSIS — M47816 Spondylosis without myelopathy or radiculopathy, lumbar region: Secondary | ICD-10-CM | POA: Diagnosis not present

## 2020-07-21 DIAGNOSIS — M8588 Other specified disorders of bone density and structure, other site: Secondary | ICD-10-CM | POA: Diagnosis not present

## 2020-07-21 DIAGNOSIS — M25552 Pain in left hip: Secondary | ICD-10-CM | POA: Diagnosis not present

## 2020-11-01 DIAGNOSIS — I10 Essential (primary) hypertension: Secondary | ICD-10-CM | POA: Diagnosis not present

## 2020-11-01 DIAGNOSIS — E875 Hyperkalemia: Secondary | ICD-10-CM | POA: Diagnosis not present

## 2020-11-01 DIAGNOSIS — N1832 Chronic kidney disease, stage 3b: Secondary | ICD-10-CM | POA: Diagnosis not present

## 2020-11-01 DIAGNOSIS — R82998 Other abnormal findings in urine: Secondary | ICD-10-CM | POA: Diagnosis not present

## 2020-11-08 DIAGNOSIS — N1832 Chronic kidney disease, stage 3b: Secondary | ICD-10-CM | POA: Diagnosis not present

## 2020-11-08 DIAGNOSIS — D631 Anemia in chronic kidney disease: Secondary | ICD-10-CM | POA: Diagnosis not present

## 2020-11-08 DIAGNOSIS — E875 Hyperkalemia: Secondary | ICD-10-CM | POA: Diagnosis not present

## 2020-11-27 ENCOUNTER — Ambulatory Visit (INDEPENDENT_AMBULATORY_CARE_PROVIDER_SITE_OTHER): Payer: PPO | Admitting: Internal Medicine

## 2020-11-27 ENCOUNTER — Encounter: Payer: Self-pay | Admitting: Internal Medicine

## 2020-11-27 VITALS — Temp 98.0°F

## 2020-11-27 DIAGNOSIS — U071 COVID-19: Secondary | ICD-10-CM | POA: Diagnosis not present

## 2020-11-27 NOTE — Progress Notes (Signed)
Temp 98 F (36.7 C)   LMP  (LMP Unknown)    Subjective:    Patient ID: Sara Cannon, female    DOB: 11/27/1932, 87 y.o.   MRN: [id number]  Chief Complaint  Patient presents with  . Covid Positive    Started on Wednesday, and tested positive today. Patient is only having nasal congestion. Patient states that she only has 1 kidney    HPI: Sara Cannon is a 87 y.o. female   This visit was completed via telephone due to the restrictions of the COVID-19 pandemic. All issues as above were discussed and addressed but no physical exam was performed. If it was felt that the patient should be evaluated in the office, they were directed there. The patient verbally consented to this visit. Patient was unable to complete an audio/visual visit due to Technical difficulties. Due to the catastrophic nature of the COVID-19 pandemic, this visit was done through audio contact only. Location of the patient: home Location of the provider: work Those involved with this call:  Provider: Charlynne Cousins, MD CMA: Frazier Butt, [place] Desk/Registration: Levert Feinstein  Time spent : on 10 mins     URI  This is a new (tested positive last night, symptoms started last wednesday no symptoms now, had a sore threat and sinus drainage, no fever no chills, no SOB) problem. The current episode started in the past 7 days. The problem has been gradually improving. There has been no fever. Pertinent negatives include no abdominal pain, chest pain, congestion, coughing, diarrhea, dysuria, ear pain, headaches, joint pain, joint swelling, nausea, neck pain, plugged ear sensation, rash, rhinorrhea, sinus pain, sneezing, sore throat, swollen glands, vomiting or wheezing.   Chief Complaint  Patient presents with  . Covid Positive    Started on Wednesday, and tested positive today. Patient is only having nasal congestion. Patient states that she only has 1 kidney    Relevant past medical, surgical, family and  social history reviewed and updated as indicated. Interim medical history since our last visit reviewed. Allergies and medications reviewed and updated.  Review of Systems  HENT:  Negative for congestion, ear pain, rhinorrhea, sinus pain, sneezing and sore throat.   Respiratory:  Negative for cough and wheezing.   Cardiovascular:  Negative for chest pain.  Gastrointestinal:  Negative for abdominal pain, diarrhea, nausea and vomiting.  Genitourinary:  Negative for dysuria.  Musculoskeletal:  Negative for joint pain and neck pain.  Skin:  Negative for rash.  Neurological:  Negative for headaches.   Per HPI unless specifically indicated above     Objective:    Temp 98 F (36.7 C)   LMP  (LMP Unknown)   Wt Readings from Last 3 Encounters:  06/13/20 150 lb 6.4 oz (68.2 kg)  12/07/19 148 lb 9.6 oz (67.4 kg)  08/26/18 145 lb (65.8 kg)    Physical Exam  Unable to peform sec to virtual visit.   Results for orders placed or performed in visit on 02/11/20  Microscopic Examination   Urine  Result Value Ref Range   WBC, UA None seen 0 - 5 /hpf   RBC 0-2 0 - 2 /hpf   Epithelial Cells (non renal) 0-10 0 - 10 /hpf   Bacteria, UA None seen None seen/Few  UA/M w/rflx Culture, Routine   Specimen: Urine   Urine  Result Value Ref Range   Specific Gravity, UA <1.005 (L) 1.005 - 1.030   pH, UA 6.0 5.0 - 7.5  Color, UA Yellow Yellow   Appearance Ur Clear Clear   Leukocytes,UA Negative Negative   Protein,UA Negative Negative/Trace   Glucose, UA Negative Negative   Ketones, UA Negative Negative   RBC, UA Trace (A) Negative   Bilirubin, UA Negative Negative   Urobilinogen, Ur 0.2 0.2 - 1.0 mg/dL   Nitrite, UA Negative Negative   Microscopic Examination See below:         Current Outpatient Medications:  .  aspirin 81 MG tablet, Take 81 mg by mouth daily., Disp: , Rfl:  .  Cholecalciferol (D3) 50 MCG (2000 UT) TABS, Take by mouth., Disp: , Rfl:  .  Docusate Calcium (STOOL SOFTENER  PO), Take 1 capsule by mouth daily., Disp: , Rfl:  .  levothyroxine (SYNTHROID) 75 MCG tablet, Take 1 tablet (75 mcg total) by mouth daily., Disp: 90 tablet, Rfl: 4 .  lisinopril (ZESTRIL) 10 MG tablet, TAKE 1 TABLET(10 MG) BY MOUTH DAILY, Disp: 90 tablet, Rfl: 4 .  Melatonin 5 MG TABS, Take 1 tablet by mouth daily., Disp: , Rfl:  .  raloxifene (EVISTA) 60 MG tablet, Take 1 tablet (60 mg total) by mouth daily., Disp: 90 tablet, Rfl: 4 .  TART CHERRY PO, Take 425 mg by mouth., Disp: , Rfl:  .  ferrous sulfate 325 (65 FE) MG EC tablet, Take by mouth., Disp: , Rfl:  .  LOKELMA 10 g PACK packet, SMARTSIG:10 By Mouth Twice a Week, Disp: , Rfl:     Assessment & Plan:  COVID : positive :  Increase fluid intake.  Headahce - tyelnol every 4-6 hrs prn. Sinus pressure: use steam inhalation.  OTC -  Allegra / claritin.  5 days quarantine. Pt verbalized understanding of such, to get to the office at today and get a curb side test for the above.  Problem List Items Addressed This Visit   None   No orders of the defined types were placed in this encounter.   No orders of the defined types were placed in this encounter.    Follow up plan: No follow-ups on file.

## 2020-12-06 DIAGNOSIS — L439 Lichen planus, unspecified: Secondary | ICD-10-CM | POA: Diagnosis not present

## 2020-12-12 ENCOUNTER — Ambulatory Visit (INDEPENDENT_AMBULATORY_CARE_PROVIDER_SITE_OTHER): Payer: PPO | Admitting: Nurse Practitioner

## 2020-12-12 ENCOUNTER — Other Ambulatory Visit: Payer: Self-pay

## 2020-12-12 ENCOUNTER — Encounter: Payer: Self-pay | Admitting: Nurse Practitioner

## 2020-12-12 VITALS — BP 132/67 | HR 90 | Temp 97.9°F | Ht 64.0 in | Wt 147.6 lb

## 2020-12-12 DIAGNOSIS — M810 Age-related osteoporosis without current pathological fracture: Secondary | ICD-10-CM

## 2020-12-12 DIAGNOSIS — Z0000 Encounter for general adult medical examination without abnormal findings: Secondary | ICD-10-CM | POA: Diagnosis not present

## 2020-12-12 DIAGNOSIS — N1832 Chronic kidney disease, stage 3b: Secondary | ICD-10-CM | POA: Diagnosis not present

## 2020-12-12 DIAGNOSIS — I129 Hypertensive chronic kidney disease with stage 1 through stage 4 chronic kidney disease, or unspecified chronic kidney disease: Secondary | ICD-10-CM | POA: Diagnosis not present

## 2020-12-12 DIAGNOSIS — E039 Hypothyroidism, unspecified: Secondary | ICD-10-CM | POA: Diagnosis not present

## 2020-12-12 NOTE — Assessment & Plan Note (Signed)
Chronic, ongoing.  At goal in office today, avoid hypotension.  Continue current medication regimen and collaboration with nephrology.  Labs up to date with nephrology in July.  Recommend continue to monitor BP at home regularly, focus on DASH diet, and avoid nephrotoxic medications.  Return in 6 months.

## 2020-12-12 NOTE — Assessment & Plan Note (Signed)
Chronic, ongoing.  Continue Evista.  Patient declines a repeat DEXA at this time.  Vit D level today. 

## 2020-12-12 NOTE — Assessment & Plan Note (Signed)
Chronic, ongoing.  Continue current medication regimen and adjust as needed based on TSH.  Labs: TSH and Free T4.  Return in 6 months.

## 2020-12-12 NOTE — Assessment & Plan Note (Signed)
Chronic, ongoing.  Continue collaboration with nephrology.  Labs up to date and reviewed, with nephrology in July.

## 2020-12-12 NOTE — Progress Notes (Signed)
BP 132/67   Pulse 90   Temp 97.9 F (36.6 C) (Oral)   Ht '5\' 4"'$  (1.626 m)   Wt 147 lb 9.6 oz (67 kg)   LMP  (LMP Unknown)   SpO2 99%   BMI 25.34 kg/m    Subjective:    Patient ID: Sara Cannon, female    DOB: August 09, 1932, 87 y.o.   MRN: [id number]  HPI: Sara Cannon is a 87 y.o. female presenting on 12/12/2020 for comprehensive medical examination. Current medical complaints include:none  She currently lives with: self Menopausal Symptoms: no   HYPOTHYROIDISM Continues on Levothyroxine 75 MCG daily with last TSH in June 2021 -- 1.920 and T4 1.60. Thyroid control status:stable Satisfied with current treatment? yes Medication side effects: no Medication compliance: good compliance Etiology of hypothyroidism:  Recent dose adjustment:no Fatigue: no Cold intolerance: no Heat intolerance: no Weight gain: no Weight loss: no Constipation: no Diarrhea/loose stools: no Palpitations: no Lower extremity edema: no Anxiety/depressed mood: no    HYPERTENSION Taking Lisinopril 10 MG and ASA. Hypertension status: stable  Satisfied with current treatment? yes Duration of hypertension: chronic BP monitoring frequency:  none BP range:  none BP medication side effects:  no Medication compliance: good compliance Previous BP meds: Aspirin: yes Recurrent headaches: no Visual changes: no Palpitations: no Dyspnea: no Chest pain: no Lower extremity edema: no Dizzy/lightheaded: no    CHRONIC KIDNEY DISEASE Last saw nephrology on 11/08/20 with CRT 1.47, BUN 32, and GFR 32, PTH 76, H/H 10.9/32.4.  Sees Dr. Candiss Norse -- was started on [facility], which costs $200 for 11 packets -- 8 packets for $100. CKD status: stable Medications renally dose: yes Previous renal evaluation: yes Pneumovax:  Up to Date Influenza Vaccine:  Up to Date   OSTEOPOROSIS Currently takes Evista and Vitamin D3.  Last DEXA in 2009, unable to pull results up on Epic.  She does not wish to obtain a repeat DEXA,  discussed at length with her. Satisfied with current treatment?: yes Medication side effects: no Medication compliance: good compliance Past osteoporosis medications/treatments:  Adequate calcium & vitamin D: yes Intolerance to bisphosphonates:no Weight bearing exercises: yes   Depression Screen done today and results listed below:  Depression screen [facility] 2/9 11/27/2020 06/13/2020 12/07/2019 05/11/2019 08/26/2018  Decreased Interest 0 0 0 0 0  Down, Depressed, Hopeless 0 0 0 0 0  PHQ - 2 Score 0 0 0 0 0  Altered sleeping - - 0 1 -  Tired, decreased energy - - 2 0 -  Change in appetite - - 0 0 -  Feeling bad or failure about yourself  - - 0 0 -  Trouble concentrating - - 0 0 -  Moving slowly or fidgety/restless - - 0 0 -  Suicidal thoughts - - 0 0 -  PHQ-9 Score - - 2 1 -  Difficult doing work/chores - - Not difficult at all Not difficult at all -    The patient does not have a history of falls. I did not complete a risk assessment for falls. A plan of care for falls was not documented.   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   Basal cell carcinoma    Broken arm    right   Cancer ([place]) 2007   Descending colon, T3, N0. Adjuvant chemotherapy.   Depression    Diverticulitis    Hypertension    Hypertensive  chronic kidney disease    Hypothyroidism    Menopausal state    Renal failure May 2015   Dr Candiss Norse    Surgical History:  Past Surgical History:  Procedure Laterality Date   CATARACT EXTRACTION Bilateral    colon resection  2007   COLONOSCOPY  2008, 2011   Dr Bary Castilla, hyperplastic rectal polyps identified May 2008.   COLONOSCOPY WITH PROPOFOL N/A 12/14/2014   Procedure: COLONOSCOPY WITH PROPOFOL;  Surgeon: Robert Bellow, MD;  Location[facility] ENDOSCOPY;  Service: Endoscopy;  Laterality: N/A;   PORT-A-CATH REMOVAL  2009   PORTACATH PLACEMENT  2007    Medications:  Current Outpatient  Medications on File Prior to Visit  Medication Sig   aspirin 81 MG tablet Take 81 mg by mouth daily.   Cholecalciferol (D3) 50 MCG (2000 UT) TABS Take by mouth.   Docusate Calcium (STOOL SOFTENER PO) Take 1 capsule by mouth daily.   ferrous sulfate 325 (65 FE) MG EC tablet Take by mouth.   levothyroxine (SYNTHROID) 75 MCG tablet Take 1 tablet (75 mcg total) by mouth daily.   lisinopril (ZESTRIL) 10 MG tablet TAKE 1 TABLET(10 MG) BY MOUTH DAILY   LOKELMA 10 g PACK packet SMARTSIG:10 By Mouth Twice a Week   Melatonin 5 MG TABS Take 1 tablet by mouth daily.   raloxifene (EVISTA) 60 MG tablet Take 1 tablet (60 mg total) by mouth daily.   TART CHERRY PO Take 425 mg by mouth.   clobetasol (TEMOVATE) 0.05 % external solution Apply topically 2 (two) times daily. (Patient not taking: Reported on 12/12/2020)   No current facility-administered medications on file prior to visit.    Allergies:  No Known Allergies  Social History:  Social History   Socioeconomic History   Marital status: Single    Spouse name: Not on file   Number of children: 0   Years of education: 1 year college    Highest education level: Some college, no degree  Occupational History   Occupation: retired  Tobacco Use   Smoking status: Never   Smokeless tobacco: Never  Vaping Use   Vaping Use: Never used  Substance and Sexual Activity   Alcohol use: No    Alcohol/week: 0.0 standard drinks   Drug use: No   Sexual activity: Never  Other Topics Concern   Not on file  Social History Narrative   Works at [employer] as Psychologist, 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    Social Determinants of Radio broadcast assistant Strain: Low Risk    Difficulty of Paying Living Expenses: Not hard at all  Food Insecurity: No Food Insecurity   Worried About Charity fundraiser in the Last Year: Never true   Arboriculturist in the Last Year: Never true  Transportation Needs: No Transportation Needs   Lack of Transportation (Medical): No    Lack of Transportation (Non-Medical): No  Physical Activity: Sufficiently Active   Days of Exercise per Week: 6 days   Minutes of Exercise per Session: 30 min  Stress: No Stress Concern Present   Feeling of Stress : Only a little  Social Connections: Moderately Isolated   Frequency of Communication with Friends and Family: More than three times a week   Frequency of Social Gatherings with Friends and Family: More than three times a week   Attends Religious Services: More than 4 times per year   Active Member of [employer] or Organizations: No   Attends Archivist Meetings:  Never   Marital Status: Widowed  Human resources officer Violence: Not At Risk   Fear of Current or Ex-Partner: No   Emotionally Abused: No   Physically Abused: No   Sexually Abused: No   Social History   Tobacco Use  Smoking Status Never  Smokeless Tobacco Never   Social History   Substance and Sexual Activity  Alcohol Use No   Alcohol/week: 0.0 standard drinks    Family History:  Family History  Problem Relation Age of Onset   Heart disease Mother    Congestive Heart Failure Mother    Hypertension Mother    Leukemia Father    Tremor Brother        x 2, 1 deceased   Tremor Maternal Grandfather     Past medical history, surgical history, medications, allergies, family history and social history reviewed with patient today and changes made to appropriate areas of the chart.   Review of Systems - negative All other ROS negative except what is listed above and in the HPI.      Objective:    BP 132/67   Pulse 90   Temp 97.9 F (36.6 C) (Oral)   Ht '5\' 4"'$  (1.626 m)   Wt 147 lb 9.6 oz (67 kg)   LMP  (LMP Unknown)   SpO2 99%   BMI 25.34 kg/m   Wt Readings from Last 3 Encounters:  12/12/20 147 lb 9.6 oz (67 kg)  06/13/20 150 lb 6.4 oz (68.2 kg)  12/07/19 148 lb 9.6 oz (67.4 kg)    Physical Exam Constitutional:      General: She is awake. She is not in acute distress.    Appearance: She is  well-developed. She is not ill-appearing.  HENT:     Head: Normocephalic and atraumatic.     Right Ear: Hearing, tympanic membrane, ear canal and external ear normal. No drainage.     Left Ear: Hearing, tympanic membrane, ear canal and external ear normal. No drainage.     Nose: Nose normal.     Right Sinus: No maxillary sinus tenderness or frontal sinus tenderness.     Left Sinus: No maxillary sinus tenderness or frontal sinus tenderness.     Mouth/Throat:     Mouth: Mucous membranes are moist.     Pharynx: Oropharynx is clear. Uvula midline. No pharyngeal swelling, oropharyngeal exudate or posterior oropharyngeal erythema.  Eyes:     General: Lids are normal.        Right eye: No discharge.        Left eye: No discharge.     Extraocular Movements: Extraocular movements intact.     Conjunctiva/sclera: Conjunctivae normal.     Pupils: Pupils are equal, round, and reactive to light.     Visual Fields: Right eye visual fields normal and left eye visual fields normal.  Neck:     Thyroid: No thyromegaly.     Vascular: No carotid bruit.     Trachea: Trachea normal.  Cardiovascular:     Rate and Rhythm: Normal rate and regular rhythm.     Heart sounds: Normal heart sounds. No murmur heard.   No gallop.  Pulmonary:     Effort: Pulmonary effort is normal. No accessory muscle usage or respiratory distress.     Breath sounds: Normal breath sounds.  Abdominal:     General: Bowel sounds are normal.     Palpations: Abdomen is soft. There is no hepatomegaly or splenomegaly.     Tenderness: There is  no abdominal tenderness.  Musculoskeletal:        General: Normal range of motion.     Cervical back: Normal range of motion and neck supple.     Right lower leg: No edema.     Left lower leg: No edema.  Lymphadenopathy:     Head:     Right side of head: No submental, submandibular, tonsillar, preauricular or posterior auricular adenopathy.     Left side of head: No submental, submandibular,  tonsillar, preauricular or posterior auricular adenopathy.     Cervical: No cervical adenopathy.  Skin:    General: Skin is warm and dry.     Capillary Refill: Capillary refill takes less than 2 seconds.     Findings: No rash.  Neurological:     Mental Status: She is alert and oriented to person, place, and time.     Cranial Nerves: Cranial nerves are intact.     Gait: Gait is intact.     Deep Tendon Reflexes: Reflexes are normal and symmetric.     Reflex Scores:      Brachioradialis reflexes are 2+ on the right side and 2+ on the left side.      Patellar reflexes are 2+ on the right side and 2+ on the left side. Psychiatric:        Attention and Perception: Attention normal.        Mood and Affect: Mood normal.        Speech: Speech normal.        Behavior: Behavior normal. Behavior is cooperative.        Thought Content: Thought content normal.        Judgment: Judgment normal.    Results for orders placed or performed in visit on 02/11/20  Microscopic Examination   Urine  Result Value Ref Range   WBC, UA None seen 0 - 5 /hpf   RBC 0-2 0 - 2 /hpf   Epithelial Cells (non renal) 0-10 0 - 10 /hpf   Bacteria, UA None seen None seen/Few  UA/M w/rflx Culture, Routine   Specimen: Urine   Urine  Result Value Ref Range   Specific Gravity, UA <1.005 (L) 1.005 - 1.030   pH, UA 6.0 5.0 - 7.5   Color, UA Yellow Yellow   Appearance Ur Clear Clear   Leukocytes,UA Negative Negative   Protein,UA Negative Negative/Trace   Glucose, UA Negative Negative   Ketones, UA Negative Negative   RBC, UA Trace (A) Negative   Bilirubin, UA Negative Negative   Urobilinogen, Ur 0.2 0.2 - 1.0 mg/dL   Nitrite, UA Negative Negative   Microscopic Examination See below:       Assessment & Plan:   Problem List Items Addressed This Visit       Endocrine   Hypothyroidism    Chronic, ongoing.  Continue current medication regimen and adjust as needed based on TSH.  Labs: TSH and Free T4.  Return in 6  months.      Relevant Orders   TSH   T4, free     Musculoskeletal and Integument   Osteoporosis    Chronic, ongoing.  Continue Evista.  Patient declines a repeat DEXA at this time.  Vit D level today.      Relevant Orders   VITAMIN D 25 Hydroxy (Vit-D Deficiency, Fractures)     Genitourinary   Hypertensive chronic kidney disease - Primary    Chronic, ongoing.  At goal in office today, avoid hypotension.  Continue current medication regimen and collaboration with nephrology.  Labs up to date with nephrology in July.  Recommend continue to monitor BP at home regularly, focus on DASH diet, and avoid nephrotoxic medications.  Return in 6 months.      CKD (chronic kidney disease) stage 3, GFR 30-59 ml/min (HCC)    Chronic, ongoing.  Continue collaboration with nephrology.  Labs up to date and reviewed, with nephrology in July.      Other Visit Diagnoses     Annual physical exam       Annual labs today and health maintenance reviewed.        Follow up plan: Return in about 6 months (around 06/14/2021) for HTN, CKD, OSTEOPOROSIS, THYROID.   LABORATORY TESTING:  - Pap smear: not applicable  IMMUNIZATIONS:   - Tdap: Tetanus vaccination status reviewed: refuses - Influenza: Up to date - Pneumovax: Up to date - Prevnar: Up to date - HPV: Not applicable - Zostavax vaccine: refused  SCREENING: -Mammogram: Not applicable  - Colonoscopy: Not applicable  - Bone Density: refuses -Hearing Test: Not applicable  -Spirometry: Not applicable   PATIENT COUNSELING:   Advised to take 1 mg of folate supplement per day if capable of pregnancy.   Sexuality: Discussed sexually transmitted diseases, partner selection, use of condoms, avoidance of unintended pregnancy  and contraceptive alternatives.   Advised to avoid cigarette smoking.  I discussed with the patient that most people either abstain from alcohol or drink within safe limits (<=14/week and <=4 drinks/occasion for males,  <=7/weeks and <= 3 drinks/occasion for females) and that the risk for alcohol disorders and other health effects rises proportionally with the number of drinks per week and how often a drinker exceeds daily limits.  Discussed cessation/primary prevention of drug use and availability of treatment for abuse.   Diet: Encouraged to adjust caloric intake to maintain  or achieve ideal body weight, to reduce intake of dietary saturated fat and total fat, to limit sodium intake by avoiding high sodium foods and not adding table salt, and to maintain adequate dietary potassium and calcium preferably from fresh fruits, vegetables, and low-fat dairy products.    Stressed the importance of regular exercise  Injury prevention: Discussed safety belts, safety helmets, smoke detector, smoking near bedding or upholstery.   Dental health: Discussed importance of regular tooth brushing, flossing, and dental visits.    NEXT PREVENTATIVE PHYSICAL DUE IN 1 YEAR. Return in about 6 months (around 06/14/2021) for HTN, CKD, OSTEOPOROSIS, THYROID.

## 2020-12-12 NOTE — Patient Instructions (Signed)
https://www.nhlbi.nih.gov/files/docs/public/heart/dash_brief.pdf">  DASH Eating Plan DASH stands for Dietary Approaches to Stop Hypertension. The DASH eating plan is a healthy eating plan that has been shown to: Reduce high blood pressure (hypertension). Reduce your risk for type 2 diabetes, heart disease, and stroke. Help with weight loss. What are tips for following this plan? Reading food labels Check food labels for the amount of salt (sodium) per serving. Choose foods with less than 5 percent of the Daily Value of sodium. Generally, foods with less than 300 milligrams (mg) of sodium per serving fit into this eating plan. To find whole grains, look for the word "whole" as the first word in the ingredient list. Shopping Buy products labeled as "low-sodium" or "no salt added." Buy fresh foods. Avoid canned foods and pre-made or frozen meals. Cooking Avoid adding salt when cooking. Use salt-free seasonings or herbs instead of table salt or sea salt. Check with your health care provider or pharmacist before using salt substitutes. Do not fry foods. Cook foods using healthy methods such as baking, boiling, grilling, roasting, and broiling instead. Cook with heart-healthy oils, such as olive, canola, avocado, soybean, or sunflower oil. Meal planning  Eat a balanced diet that includes: 4 or more servings of fruits and 4 or more servings of vegetables each day. Try to fill one-half of your plate with fruits and vegetables. 6-8 servings of whole grains each day. Less than 6 oz (170 g) of lean meat, poultry, or fish each day. A 3-oz (85-g) serving of meat is about the same size as a deck of cards. One egg equals 1 oz (28 g). 2-3 servings of low-fat dairy each day. One serving is 1 cup (237 mL). 1 serving of nuts, seeds, or beans 5 times each week. 2-3 servings of heart-healthy fats. Healthy fats called omega-3 fatty acids are found in foods such as walnuts, flaxseeds, fortified milks, and eggs.  These fats are also found in cold-water fish, such as sardines, salmon, and mackerel. Limit how much you eat of: Canned or prepackaged foods. Food that is high in trans fat, such as some fried foods. Food that is high in saturated fat, such as fatty meat. Desserts and other sweets, sugary drinks, and other foods with added sugar. Full-fat dairy products. Do not salt foods before eating. Do not eat more than 4 egg yolks a week. Try to eat at least 2 vegetarian meals a week. Eat more home-cooked food and less restaurant, buffet, and fast food.  Lifestyle When eating at a restaurant, ask that your food be prepared with less salt or no salt, if possible. If you drink alcohol: Limit how much you use to: 0-1 drink a day for women who are not pregnant. 0-2 drinks a day for men. Be aware of how much alcohol is in your drink. In the [place] one drink equals one 12 oz bottle of beer (355 mL), one 5 oz glass of wine (148 mL), or one 1 oz glass of hard liquor (44 mL). General information Avoid eating more than 2,300 mg of salt a day. If you have hypertension, you may need to reduce your sodium intake to 1,500 mg a day. Work with your health care provider to maintain a healthy body weight or to lose weight. Ask what an ideal weight is for you. Get at least 30 minutes of exercise that causes your heart to beat faster (aerobic exercise) most days of the week. Activities may include walking, swimming, or biking. Work with your health care provider   or dietitian to adjust your eating plan to your individual calorie needs. What foods should I eat? Fruits All fresh, dried, or frozen fruit. Canned fruit in natural juice (without addedsugar). Vegetables Fresh or frozen vegetables (raw, steamed, roasted, or grilled). Low-sodium or reduced-sodium tomato and vegetable juice. Low-sodium or reduced-sodium tomatosauce and tomato paste. Low-sodium or reduced-sodium canned vegetables. Grains Whole-grain or  whole-wheat bread. Whole-grain or whole-wheat pasta. Brown rice. Oatmeal. Quinoa. Bulgur. Whole-grain and low-sodium cereals. Pita bread.Low-fat, low-sodium crackers. Whole-wheat flour tortillas. Meats and other proteins Skinless chicken or turkey. Ground chicken or turkey. Pork with fat trimmed off. Fish and seafood. Egg whites. Dried beans, peas, or lentils. Unsalted nuts, nut butters, and seeds. Unsalted canned beans. Lean cuts of beef with fat trimmed off. Low-sodium, lean precooked or cured meat, such as sausages or meatloaves. Dairy Low-fat (1%) or fat-free (skim) milk. Reduced-fat, low-fat, or fat-free cheeses. Nonfat, low-sodium ricotta or cottage cheese. Low-fat or nonfatyogurt. Low-fat, low-sodium cheese. Fats and oils Soft margarine without trans fats. Vegetable oil. Reduced-fat, low-fat, or light mayonnaise and salad dressings (reduced-sodium). Canola, safflower, olive, avocado, soybean, andsunflower oils. Avocado. Seasonings and condiments Herbs. Spices. Seasoning mixes without salt. Other foods Unsalted popcorn and pretzels. Fat-free sweets. The items listed above may not be a complete list of foods and beverages you can eat. Contact a dietitian for more information. What foods should I avoid? Fruits Canned fruit in a light or heavy syrup. Fried fruit. Fruit in cream or buttersauce. Vegetables Creamed or fried vegetables. Vegetables in a cheese sauce. Regular canned vegetables (not low-sodium or reduced-sodium). Regular canned tomato sauce and paste (not low-sodium or reduced-sodium). Regular tomato and vegetable juice(not low-sodium or reduced-sodium). Pickles. Olives. Grains Baked goods made with fat, such as croissants, muffins, or some breads. Drypasta or rice meal packs. Meats and other proteins Fatty cuts of meat. Ribs. Fried meat. Bacon. Bologna, salami, and other precooked or cured meats, such as sausages or meat loaves. Fat from the back of a pig (fatback). Bratwurst.  Salted nuts and seeds. Canned beans with added salt. Canned orsmoked fish. Whole eggs or egg yolks. Chicken or turkey with skin. Dairy Whole or 2% milk, cream, and half-and-half. Whole or full-fat cream cheese. Whole-fat or sweetened yogurt. Full-fat cheese. Nondairy creamers. Whippedtoppings. Processed cheese and cheese spreads. Fats and oils Butter. Stick margarine. Lard. Shortening. Ghee. Bacon fat. Tropical oils, suchas coconut, palm kernel, or palm oil. Seasonings and condiments Onion salt, garlic salt, seasoned salt, table salt, and sea salt. Worcestershire sauce. Tartar sauce. Barbecue sauce. Teriyaki sauce. Soy sauce, including reduced-sodium. Steak sauce. Canned and packaged gravies. Fish sauce. Oyster sauce. Cocktail sauce. Store-bought horseradish. Ketchup. Mustard. Meat flavorings and tenderizers. Bouillon cubes. Hot sauces. Pre-made or packaged marinades. Pre-made or packaged taco seasonings. Relishes. Regular saladdressings. Other foods Salted popcorn and pretzels. The items listed above may not be a complete list of foods and beverages you should avoid. Contact a dietitian for more information. Where to find more information National Heart, Lung, and Blood Institute: www.nhlbi.nih.gov American Heart Association: www.heart.org Academy of Nutrition and Dietetics: www.eatright.org National Kidney Foundation: www.kidney.org Summary The DASH eating plan is a healthy eating plan that has been shown to reduce high blood pressure (hypertension). It may also reduce your risk for type 2 diabetes, heart disease, and stroke. When on the DASH eating plan, aim to eat more fresh fruits and vegetables, whole grains, lean proteins, low-fat dairy, and heart-healthy fats. With the DASH eating plan, you should limit salt (sodium) intake to 2,300   mg a day. If you have hypertension, you may need to reduce your sodium intake to 1,500 mg a day. Work with your health care provider or dietitian to adjust  your eating plan to your individual calorie needs. This information is not intended to replace advice given to you by your health care provider. Make sure you discuss any questions you have with your healthcare provider. Document Revised: 03/19/2019 Document Reviewed: 03/19/2019 Elsevier Patient Education  2022 Elsevier Inc.  

## 2020-12-16 LAB — VITAMIN D 25 HYDROXY (VIT D DEFICIENCY, FRACTURES): Vit D, 25-Hydroxy: 38.5 ng/mL (ref 30.0–100.0)

## 2020-12-16 LAB — T4, FREE: Free T4: 1.55 ng/dL (ref 0.82–1.77)

## 2020-12-16 LAB — TSH: TSH: 2.74 u[IU]/mL (ref 0.450–4.500)

## 2020-12-16 NOTE — Progress Notes (Signed)
Good morning, please let Sara Cannon know all her labs have returned and are nice and normal.  Continue all current medications and supplements.  Have a wonderful day!! Keep being awesome!!  Thank you for allowing me to participate in your care.  I appreciate you. Kindest regards, Marcas Bowsher

## 2021-03-06 DIAGNOSIS — L439 Lichen planus, unspecified: Secondary | ICD-10-CM | POA: Diagnosis not present

## 2021-03-06 DIAGNOSIS — L648 Other androgenic alopecia: Secondary | ICD-10-CM | POA: Diagnosis not present

## 2021-03-07 DIAGNOSIS — D631 Anemia in chronic kidney disease: Secondary | ICD-10-CM | POA: Diagnosis not present

## 2021-03-07 DIAGNOSIS — E875 Hyperkalemia: Secondary | ICD-10-CM | POA: Diagnosis not present

## 2021-03-07 DIAGNOSIS — N1832 Chronic kidney disease, stage 3b: Secondary | ICD-10-CM | POA: Diagnosis not present

## 2021-03-14 DIAGNOSIS — E875 Hyperkalemia: Secondary | ICD-10-CM | POA: Diagnosis not present

## 2021-03-14 DIAGNOSIS — I10 Essential (primary) hypertension: Secondary | ICD-10-CM | POA: Diagnosis not present

## 2021-03-14 DIAGNOSIS — N261 Atrophy of kidney (terminal): Secondary | ICD-10-CM | POA: Diagnosis not present

## 2021-03-14 DIAGNOSIS — N1832 Chronic kidney disease, stage 3b: Secondary | ICD-10-CM | POA: Diagnosis not present

## 2021-05-08 DIAGNOSIS — H353131 Nonexudative age-related macular degeneration, bilateral, early dry stage: Secondary | ICD-10-CM | POA: Diagnosis not present

## 2021-06-12 DIAGNOSIS — L648 Other androgenic alopecia: Secondary | ICD-10-CM | POA: Diagnosis not present

## 2021-06-12 DIAGNOSIS — L439 Lichen planus, unspecified: Secondary | ICD-10-CM | POA: Diagnosis not present

## 2021-06-17 NOTE — Patient Instructions (Signed)
Chronic Kidney Disease, Adult Chronic kidney disease is when lasting damage happens to the kidneys slowly over a long time. The kidneys help to: Make pee (urine). Make hormones. Keep the right amount of fluids and chemicals in the body. Most often, this disease does not go away. You must take steps to help keep the kidney damage from getting worse. If steps are not taken, the kidneys might stop working forever. What are the causes? Diabetes. High blood pressure. Diseases that affect the heart and blood vessels. Other kidney diseases. Diseases of the body's disease-fighting system. A problem with the flow of pee. Infections of the organs that make pee, store it, and take it out of the body. Swelling or irritation of your blood vessels. What increases the risk? Getting older. Having someone in your family who has kidney disease or kidney failure. Having a disease caused by genes. Taking medicines often that harm the kidneys. Being near or having contact with harmful substances. Being very overweight. Using tobacco now or in the past. What are the signs or symptoms? Feeling very tired. Having a swollen face, legs, ankles, or feet. Feeling like you may vomit or vomiting. Not feeling hungry. Being confused or not able to focus. Twitches and cramps in the leg muscles or other muscles. Dry, itchy skin. A taste of metal in your mouth. Making less pee, or making more pee. Shortness of breath. Trouble sleeping. You may also become anemic or get weak bones. Anemic means there is not enough red blood cells or hemoglobin in your blood. You may get symptoms slowly. You may not notice them until the kidney damage gets very bad. How is this treated? Often, there is no cure for this disease. Treatment can help with symptoms and help keep the disease from getting worse. You may need to: Avoid alcohol. Avoid foods that are high in salt, potassium, phosphorous, and protein. Take medicines for  symptoms and to help control other conditions. Have dialysis. This treatment gets harmful waste out of your body. Treat other problems that cause your kidney disease or make it worse. Follow these instructions at home: Medicines Take over-the-counter and prescription medicines only as told by your doctor. Do not take any new medicines, vitamins, or supplements unless your doctor says it is okay. Lifestyle  Do not smoke or use any products that contain nicotine or tobacco. If you need help quitting, ask your doctor. If you drink alcohol: Limit how much you use to: 0-1 drink a day for women who are not pregnant. 0-2 drinks a day for men. Know how much alcohol is in your drink. In the [place] one drink equals one 12 oz bottle of beer (355 mL), one 5 oz glass of wine (148 mL), or one 1 oz glass of hard liquor (44 mL). Stay at a healthy weight. If you need help losing weight, ask your doctor. General instructions  Follow instructions from your doctor about what you cannot eat or drink. Track your blood pressure at home. Tell your doctor about any changes. If you have diabetes, track your blood sugar. Exercise at least 30 minutes a day, 5 days a week. Keep your shots (vaccinations) up to date. Keep all follow-up visits. Where to find more information American Association of Kidney Patients: www.aakp.org National Kidney Foundation: www.kidney.org American Kidney Fund: www.akfinc.org Life Options: www.lifeoptions.org Kidney School: www.kidneyschool.org Contact a doctor if: Your symptoms get worse. You get new symptoms. Get help right away if: You get symptoms of end-stage kidney disease. These   include: Headaches. Losing feeling in your hands or feet. Easy bruising. Having hiccups often. Chest pain. Shortness of breath. Lack of menstrual periods, in women. You have a fever. You make less pee than normal. You have pain or you bleed when you pee or poop. These symptoms may be an  emergency. Get help right away. Call your local emergency services (911 in the [place] Do not wait to see if the symptoms will go away. Do not drive yourself to the hospital. Summary Chronic kidney disease is when lasting damage happens to the kidneys slowly over a long time. Causes of this disease include diabetes and high blood pressure. Often, there is no cure for this disease. Treatment can help symptoms and help keep the disease from getting worse. Treatment may involve lifestyle changes, medicines, and dialysis. This information is not intended to replace advice given to you by your health care provider. Make sure you discuss any questions you have with your health care provider. Document Revised: 07/21/2019 Document Reviewed: 07/21/2019 Elsevier Patient Education  2022 Elsevier Inc.  

## 2021-06-19 ENCOUNTER — Ambulatory Visit (INDEPENDENT_AMBULATORY_CARE_PROVIDER_SITE_OTHER): Payer: PPO | Admitting: Nurse Practitioner

## 2021-06-19 ENCOUNTER — Encounter: Payer: Self-pay | Admitting: Nurse Practitioner

## 2021-06-19 ENCOUNTER — Other Ambulatory Visit: Payer: Self-pay

## 2021-06-19 VITALS — BP 104/66 | HR 77 | Temp 98.2°F | Ht 64.0 in | Wt 146.0 lb

## 2021-06-19 DIAGNOSIS — I129 Hypertensive chronic kidney disease with stage 1 through stage 4 chronic kidney disease, or unspecified chronic kidney disease: Secondary | ICD-10-CM | POA: Diagnosis not present

## 2021-06-19 DIAGNOSIS — F5101 Primary insomnia: Secondary | ICD-10-CM

## 2021-06-19 DIAGNOSIS — E039 Hypothyroidism, unspecified: Secondary | ICD-10-CM | POA: Diagnosis not present

## 2021-06-19 DIAGNOSIS — M810 Age-related osteoporosis without current pathological fracture: Secondary | ICD-10-CM | POA: Diagnosis not present

## 2021-06-19 DIAGNOSIS — N1832 Chronic kidney disease, stage 3b: Secondary | ICD-10-CM | POA: Diagnosis not present

## 2021-06-19 MED ORDER — LEVOTHYROXINE SODIUM 75 MCG PO TABS: 75.0000 ug | ORAL_TABLET | Freq: Every day | ORAL | 4 refills | Status: DC

## 2021-06-19 MED ORDER — RALOXIFENE HCL 60 MG PO TABS: 60.0000 mg | ORAL_TABLET | Freq: Every day | ORAL | 4 refills | Status: DC

## 2021-06-19 MED ORDER — LISINOPRIL 10 MG PO TABS: ORAL_TABLET | ORAL | 4 refills | Status: DC

## 2021-06-19 NOTE — Progress Notes (Addendum)
BP 104/66    Pulse 77    Temp 98.2 F (36.8 C)    Ht 5\' 4"  (1.626 m)    Wt 146 lb (66.2 kg)    LMP  (LMP Unknown)    SpO2 97%    BMI 25.06 kg/m    Subjective:    Patient ID: Sara Cannon, female    DOB: Jul 10, 1932, 88 y.o.   MRN: [id number]  NOTE WRITTEN BY [employer] DNP STUDENT.  ASSESSMENT AND PLAN OF CARE REVIEWED WITH STUDENT, AGREE WITH ABOVE FINDINGS AND PLAN.   HPI: Sara Cannon is a 88 y.o. female here for follow up on HTN, hypothyroid, IBS, anxiety, insomnia and CKD. She does not have any other concerns today.  Chief Complaint  Patient presents with   Hypertension   Chronic Kidney Disease   Osteoporosis   Hypothyroidism   HYPOTHYROIDISM Continues on Levothyroxine 75 MCG daily. Thyroid control status:stable Satisfied with current treatment? yes Medication side effects: no Medication compliance: good compliance Etiology of hypothyroidism:  Recent dose adjustment:no Fatigue: no Cold intolerance: no Heat intolerance: no Weight gain: no Weight loss: no Constipation: no Diarrhea/loose stools: no Palpitations: no Lower extremity edema: no Anxiety/depressed mood: no    HYPERTENSION Taking Lisinopril 10 MG and ASA. Hypertension status: stable  Satisfied with current treatment? yes Duration of hypertension: chronic BP monitoring frequency:  none BP range:  none BP medication side effects:  no Medication compliance: good compliance Previous BP meds: Aspirin: yes Recurrent headaches: no Visual changes: no Palpitations: no Dyspnea: no Chest pain: no Lower extremity edema: no Dizzy/lightheaded: no    CHRONIC KIDNEY DISEASE Last saw nephrology on 03/07/21 with CRT 1.38, BUN 31, and GFR 37, PTH 74, H/H 11.2/33.4.  Sees Dr. Candiss Norse. CKD status: stable Medications renally dose: yes Previous renal evaluation: yes Pneumovax:  Up to Date Influenza Vaccine:  Up to Date   OSTEOPOROSIS Currently takes Evista and Vitamin D3.  Last DEXA in 2009,  not available in  [place].  She does not wish to obtain a repeat DEXA, discussed at length with her. Satisfied with current treatment?: yes Medication side effects: no Medication compliance: good compliance Past osteoporosis medications/treatments:  Adequate calcium & vitamin D: yes Intolerance to bisphosphonates:no Weight bearing exercises: yes    Relevant past medical, surgical, family and social history reviewed and updated as indicated. Interim medical history since our last visit reviewed. Allergies and medications reviewed and updated.  Review of Systems  Constitutional:  Negative for activity change, appetite change, chills, fatigue and unexpected weight change.  Eyes:  Negative for photophobia and visual disturbance.  Respiratory:  Negative for cough, chest tightness and shortness of breath.   Cardiovascular:  Negative for chest pain, palpitations and leg swelling.  Gastrointestinal:  Negative for constipation, diarrhea, nausea and vomiting.  Endocrine: Negative for cold intolerance and heat intolerance.  Neurological:  Negative for dizziness, syncope, weakness, light-headedness, numbness and headaches.   Per HPI unless specifically indicated above     Objective:    BP 104/66    Pulse 77    Temp 98.2 F (36.8 C)    Ht 5\' 4"  (1.626 m)    Wt 146 lb (66.2 kg)    LMP  (LMP Unknown)    SpO2 97%    BMI 25.06 kg/m   Wt Readings from Last 3 Encounters:  06/19/21 146 lb (66.2 kg)  12/12/20 147 lb 9.6 oz (67 kg)  06/13/20 150 lb 6.4 oz (68.2 kg)    Physical  Exam Constitutional:      Appearance: Normal appearance. She is well-groomed.  HENT:     Head: Normocephalic.     Jaw: No tenderness or swelling.     Salivary Glands: Right salivary gland is not diffusely enlarged or tender. Left salivary gland is not diffusely enlarged or tender.     Nose: Nose normal.     Mouth/Throat:     Mouth: Mucous membranes are moist.     Pharynx: Oropharynx is clear.  Eyes:     General: Lids are normal.         Right eye: No discharge.        Left eye: No discharge.     Conjunctiva/sclera: Conjunctivae normal.  Neck:     Thyroid: No thyromegaly or thyroid tenderness.     Vascular: No carotid bruit.  Cardiovascular:     Rate and Rhythm: Normal rate and regular rhythm.     Pulses: Normal pulses.     Heart sounds: Normal heart sounds. No murmur heard.   No gallop.  Pulmonary:     Effort: Pulmonary effort is normal. No accessory muscle usage or respiratory distress.     Breath sounds: Normal breath sounds. No wheezing or rhonchi.  Abdominal:     General: Bowel sounds are normal.     Palpations: Abdomen is soft.     Tenderness: There is no abdominal tenderness.  Musculoskeletal:     Cervical back: Full passive range of motion without pain.     Right lower leg: No edema.     Left lower leg: No edema.  Lymphadenopathy:     Head:     Right side of head: No submental, submandibular, tonsillar, preauricular or posterior auricular adenopathy.     Left side of head: No submental, submandibular, tonsillar, preauricular or posterior auricular adenopathy.     Cervical: No cervical adenopathy.  Skin:    General: Skin is warm and dry.     Capillary Refill: Capillary refill takes less than 2 seconds.  Neurological:     Mental Status: She is alert and oriented to person, place, and time.     Motor: No weakness or tremor.  Psychiatric:        Attention and Perception: Attention normal.        Mood and Affect: Mood normal.        Speech: Speech normal.        Behavior: Behavior normal. Behavior is cooperative.        Thought Content: Thought content normal.        Judgment: Judgment normal.    Results for orders placed or performed in visit on 12/12/20  TSH  Result Value Ref Range   TSH 2.740 0.450 - 4.500 uIU/mL  T4, free  Result Value Ref Range   Free T4 1.55 0.82 - 1.77 ng/dL  VITAMIN D 25 Hydroxy (Vit-D Deficiency, Fractures)  Result Value Ref Range   Vit D, 25-Hydroxy 38.5 30.0 - 100.0  ng/mL      Assessment & Plan:   Problem List Items Addressed This Visit       Endocrine   Hypothyroidism    Chronic, ongoing.  Continue current medication regimen and adjust as needed based on TSH.  Labs next visit at physical. Refill sent to pharmacy. Return in 6 months.      Relevant Medications   levothyroxine (SYNTHROID) 75 MCG tablet     Musculoskeletal and Integument   Osteoporosis    Chronic, ongoing.  Continue Evista. Refill sent to pharmacy. Patient declines a repeat DEXA at this time. Will check labs next visit.      Relevant Medications   raloxifene (EVISTA) 60 MG tablet     Genitourinary   CKD (chronic kidney disease) stage 3, GFR 30-59 ml/min (HCC)    Chronic, ongoing.  Continue collaboration with nephrology.  Labs up to date and reviewed, with nephrology in November.      Hypertensive chronic kidney disease - Primary    Chronic, ongoing, at goal. Avoid hypotension. Labs up to date with nephrology in November. Continue current medication regimen and collaboration with nephrology. Recommend continue to monitor BP at home regularly, focus on DASH diet, and avoid nephrotoxic medications.  Return in 6 months.        Other   Primary insomnia    States she occasionally have some difficulty with sleeping. She takes melatonin which is helping. Continue with this regimen. Notify provider if insomnia worsens.        Follow up plan: Return in about 6 months (around 12/17/2021) for Annual physical.

## 2021-06-19 NOTE — Assessment & Plan Note (Addendum)
Chronic, ongoing, at goal. Avoid hypotension. Labs up to date with nephrology in November. Continue current medication regimen and collaboration with nephrology. Recommend continue to monitor BP at home regularly, focus on DASH diet, and avoid nephrotoxic medications.  Return in 6 months.

## 2021-06-19 NOTE — Assessment & Plan Note (Signed)
Chronic, ongoing.  Continue Evista. Refill sent to pharmacy. Patient declines a repeat DEXA at this time. Will check labs next visit.

## 2021-06-19 NOTE — Assessment & Plan Note (Signed)
States she occasionally have some difficulty with sleeping. She takes melatonin which is helping. Continue with this regimen. Notify provider if insomnia worsens.

## 2021-06-19 NOTE — Assessment & Plan Note (Addendum)
Chronic, ongoing.  Continue collaboration with nephrology.  Labs up to date and reviewed, with nephrology in November. 

## 2021-06-19 NOTE — Assessment & Plan Note (Signed)
Chronic, ongoing.  Continue current medication regimen and adjust as needed based on TSH.  Labs next visit at physical. Refill sent to pharmacy. Return in 6 months.

## 2021-07-05 DIAGNOSIS — E875 Hyperkalemia: Secondary | ICD-10-CM | POA: Diagnosis not present

## 2021-07-05 DIAGNOSIS — N261 Atrophy of kidney (terminal): Secondary | ICD-10-CM | POA: Diagnosis not present

## 2021-07-05 DIAGNOSIS — I10 Essential (primary) hypertension: Secondary | ICD-10-CM | POA: Diagnosis not present

## 2021-07-05 DIAGNOSIS — N1832 Chronic kidney disease, stage 3b: Secondary | ICD-10-CM | POA: Diagnosis not present

## 2021-07-12 DIAGNOSIS — N261 Atrophy of kidney (terminal): Secondary | ICD-10-CM | POA: Diagnosis not present

## 2021-07-12 DIAGNOSIS — I10 Essential (primary) hypertension: Secondary | ICD-10-CM | POA: Diagnosis not present

## 2021-07-12 DIAGNOSIS — N1832 Chronic kidney disease, stage 3b: Secondary | ICD-10-CM | POA: Diagnosis not present

## 2021-07-12 DIAGNOSIS — E875 Hyperkalemia: Secondary | ICD-10-CM | POA: Diagnosis not present

## 2021-07-26 DIAGNOSIS — E875 Hyperkalemia: Secondary | ICD-10-CM | POA: Diagnosis not present

## 2021-07-26 DIAGNOSIS — I10 Essential (primary) hypertension: Secondary | ICD-10-CM | POA: Diagnosis not present

## 2021-07-26 DIAGNOSIS — N1832 Chronic kidney disease, stage 3b: Secondary | ICD-10-CM | POA: Diagnosis not present

## 2021-07-26 DIAGNOSIS — N261 Atrophy of kidney (terminal): Secondary | ICD-10-CM | POA: Diagnosis not present

## 2021-08-08 ENCOUNTER — Other Ambulatory Visit: Payer: Self-pay | Admitting: Nurse Practitioner

## 2021-08-09 NOTE — Telephone Encounter (Signed)
Refused refill request for Synthroid and Evista because on 06/19/2021 #90, 4 refills were given for both medications.   Received by [facility] on 06/19/2021 at 1:39 PM for both. ? ?Dexa scn due and calcium level due however in Jolene's office note it's mentioned these will be addressed at next visit during her physical. ?

## 2021-09-11 DIAGNOSIS — L821 Other seborrheic keratosis: Secondary | ICD-10-CM | POA: Diagnosis not present

## 2021-09-11 DIAGNOSIS — L298 Other pruritus: Secondary | ICD-10-CM | POA: Diagnosis not present

## 2021-09-11 DIAGNOSIS — L648 Other androgenic alopecia: Secondary | ICD-10-CM | POA: Diagnosis not present

## 2021-09-11 DIAGNOSIS — L439 Lichen planus, unspecified: Secondary | ICD-10-CM | POA: Diagnosis not present

## 2021-11-05 DIAGNOSIS — N261 Atrophy of kidney (terminal): Secondary | ICD-10-CM | POA: Diagnosis not present

## 2021-11-05 DIAGNOSIS — I10 Essential (primary) hypertension: Secondary | ICD-10-CM | POA: Diagnosis not present

## 2021-11-05 DIAGNOSIS — N1832 Chronic kidney disease, stage 3b: Secondary | ICD-10-CM | POA: Diagnosis not present

## 2021-11-05 DIAGNOSIS — E875 Hyperkalemia: Secondary | ICD-10-CM | POA: Diagnosis not present

## 2021-11-11 DIAGNOSIS — E875 Hyperkalemia: Secondary | ICD-10-CM | POA: Insufficient documentation

## 2021-11-12 DIAGNOSIS — E875 Hyperkalemia: Secondary | ICD-10-CM | POA: Diagnosis not present

## 2021-11-12 DIAGNOSIS — I129 Hypertensive chronic kidney disease with stage 1 through stage 4 chronic kidney disease, or unspecified chronic kidney disease: Secondary | ICD-10-CM | POA: Diagnosis not present

## 2021-11-12 DIAGNOSIS — N1832 Chronic kidney disease, stage 3b: Secondary | ICD-10-CM | POA: Diagnosis not present

## 2021-11-12 DIAGNOSIS — N261 Atrophy of kidney (terminal): Secondary | ICD-10-CM | POA: Diagnosis not present

## 2021-11-26 DIAGNOSIS — E875 Hyperkalemia: Secondary | ICD-10-CM | POA: Diagnosis not present

## 2021-11-26 DIAGNOSIS — I129 Hypertensive chronic kidney disease with stage 1 through stage 4 chronic kidney disease, or unspecified chronic kidney disease: Secondary | ICD-10-CM | POA: Diagnosis not present

## 2021-11-26 DIAGNOSIS — N261 Atrophy of kidney (terminal): Secondary | ICD-10-CM | POA: Diagnosis not present

## 2021-11-26 DIAGNOSIS — N1832 Chronic kidney disease, stage 3b: Secondary | ICD-10-CM | POA: Diagnosis not present

## 2021-12-18 ENCOUNTER — Ambulatory Visit: Payer: PPO | Admitting: Nurse Practitioner

## 2022-03-19 DIAGNOSIS — Z85828 Personal history of other malignant neoplasm of skin: Secondary | ICD-10-CM | POA: Diagnosis not present

## 2022-03-19 DIAGNOSIS — L570 Actinic keratosis: Secondary | ICD-10-CM | POA: Diagnosis not present

## 2022-03-19 DIAGNOSIS — L578 Other skin changes due to chronic exposure to nonionizing radiation: Secondary | ICD-10-CM | POA: Diagnosis not present

## 2022-03-19 DIAGNOSIS — L648 Other androgenic alopecia: Secondary | ICD-10-CM | POA: Diagnosis not present

## 2022-03-19 DIAGNOSIS — D225 Melanocytic nevi of trunk: Secondary | ICD-10-CM | POA: Diagnosis not present

## 2022-03-19 DIAGNOSIS — L439 Lichen planus, unspecified: Secondary | ICD-10-CM | POA: Diagnosis not present

## 2022-03-20 DIAGNOSIS — I10 Essential (primary) hypertension: Secondary | ICD-10-CM | POA: Diagnosis not present

## 2022-03-26 DIAGNOSIS — I10 Essential (primary) hypertension: Secondary | ICD-10-CM | POA: Diagnosis not present

## 2022-03-26 DIAGNOSIS — I129 Hypertensive chronic kidney disease with stage 1 through stage 4 chronic kidney disease, or unspecified chronic kidney disease: Secondary | ICD-10-CM | POA: Diagnosis not present

## 2022-03-26 DIAGNOSIS — N261 Atrophy of kidney (terminal): Secondary | ICD-10-CM | POA: Diagnosis not present

## 2022-03-26 DIAGNOSIS — D631 Anemia in chronic kidney disease: Secondary | ICD-10-CM | POA: Diagnosis not present

## 2022-03-26 DIAGNOSIS — E875 Hyperkalemia: Secondary | ICD-10-CM | POA: Diagnosis not present

## 2022-03-26 DIAGNOSIS — N1832 Chronic kidney disease, stage 3b: Secondary | ICD-10-CM | POA: Diagnosis not present

## 2022-03-28 ENCOUNTER — Telehealth: Payer: Self-pay

## 2022-03-28 NOTE — Telephone Encounter (Signed)
Called to schedule appt for physical , left voice mail to return call

## 2022-03-28 NOTE — Telephone Encounter (Signed)
Pt called back and stated will will schedule after her appt on Monday with the kidney doctor.

## 2022-04-01 DIAGNOSIS — I129 Hypertensive chronic kidney disease with stage 1 through stage 4 chronic kidney disease, or unspecified chronic kidney disease: Secondary | ICD-10-CM | POA: Diagnosis not present

## 2022-04-01 DIAGNOSIS — N261 Atrophy of kidney (terminal): Secondary | ICD-10-CM | POA: Diagnosis not present

## 2022-04-01 DIAGNOSIS — E875 Hyperkalemia: Secondary | ICD-10-CM | POA: Diagnosis not present

## 2022-04-01 DIAGNOSIS — N1832 Chronic kidney disease, stage 3b: Secondary | ICD-10-CM | POA: Diagnosis not present

## 2022-04-22 NOTE — Patient Instructions (Signed)
Please call to schedule your mammogram and/or bone density: [facility] at [place]: [address], [place], [place] [address] Phone: [phone]  [place] [facility] [address] [place],  [place]  [address] Phone: [phone]    DASH Eating Plan DASH stands for Dietary Approaches to Stop Hypertension. The DASH eating plan is a healthy eating plan that has been shown to: Reduce high blood pressure (hypertension). Reduce your risk for type 2 diabetes, heart disease, and stroke. Help with weight loss. What are tips for following this plan? Reading food labels Check food labels for the amount of salt (sodium) per serving. Choose foods with less than 5 percent of the Daily Value of sodium. Generally, foods with less than 300 milligrams (mg) of sodium per serving fit into this eating plan. To find whole grains, look for the word "whole" as the first word in the ingredient list. Shopping Buy products labeled as "low-sodium" or "no salt added." Buy fresh foods. Avoid canned foods and pre-made or frozen meals. Cooking Avoid adding salt when cooking. Use salt-free seasonings or herbs instead of table salt or sea salt. Check with your health care provider or pharmacist before using salt substitutes. Do not fry foods. Cook foods using healthy methods such as baking, boiling, grilling, roasting, and broiling instead. Cook with heart-healthy oils, such as olive, canola, avocado, soybean, or sunflower oil. Meal planning  Eat a balanced diet that includes: 4 or more servings of fruits and 4 or more servings of vegetables each day. Try to fill one-half of your plate with fruits and vegetables. 6-8 servings of whole grains each day. Less than 6 oz (170 g) of lean meat, poultry, or fish each day. A 3-oz (85-g) serving of meat is about the same size as a deck of cards. One egg equals 1 oz (28 g). 2-3 servings of low-fat dairy each  day. One serving is 1 cup (237 mL). 1 serving of nuts, seeds, or beans 5 times each week. 2-3 servings of heart-healthy fats. Healthy fats called omega-3 fatty acids are found in foods such as walnuts, flaxseeds, fortified milks, and eggs. These fats are also found in cold-water fish, such as sardines, salmon, and mackerel. Limit how much you eat of: Canned or prepackaged foods. Food that is high in trans fat, such as some fried foods. Food that is high in saturated fat, such as fatty meat. Desserts and other sweets, sugary drinks, and other foods with added sugar. Full-fat dairy products. Do not salt foods before eating. Do not eat more than 4 egg yolks a week. Try to eat at least 2 vegetarian meals a week. Eat more home-cooked food and less restaurant, buffet, and fast food. Lifestyle When eating at a restaurant, ask that your food be prepared with less salt or no salt, if possible. If you drink alcohol: Limit how much you use to: 0-1 drink a day for women who are not pregnant. 0-2 drinks a day for men. Be aware of how much alcohol is in your drink. In the [place] one drink equals one 12 oz bottle of beer (355 mL), one 5 oz glass of wine (148 mL), or one 1 oz glass of hard liquor (44 mL). General information Avoid eating more than 2,300 mg of salt a day. If you have hypertension, you may need to reduce your sodium intake to 1,500 mg a day. Work with your health care provider to maintain a healthy  body weight or to lose weight. Ask what an ideal weight is for you. Get at least 30 minutes of exercise that causes your heart to beat faster (aerobic exercise) most days of the week. Activities may include walking, swimming, or biking. Work with your health care provider or dietitian to adjust your eating plan to your individual calorie needs. What foods should I eat? Fruits All fresh, dried, or frozen fruit. Canned fruit in natural juice (without added sugar). Vegetables Fresh or frozen  vegetables (raw, steamed, roasted, or grilled). Low-sodium or reduced-sodium tomato and vegetable juice. Low-sodium or reduced-sodium tomato sauce and tomato paste. Low-sodium or reduced-sodium canned vegetables. Grains Whole-grain or whole-wheat bread. Whole-grain or whole-wheat pasta. Brown rice. [place]. Bulgur. Whole-grain and low-sodium cereals. Pita bread. Low-fat, low-sodium crackers. Whole-wheat flour tortillas. Meats and other proteins Skinless chicken or [place]. Ground chicken or [place]. Pork with fat trimmed off. Fish and seafood. Egg whites. Dried beans, peas, or lentils. Unsalted nuts, nut butters, and seeds. Unsalted canned beans. Lean cuts of beef with fat trimmed off. Low-sodium, lean precooked or cured meat, such as sausages or meat loaves. Dairy Low-fat (1%) or fat-free (skim) milk. Reduced-fat, low-fat, or fat-free cheeses. Nonfat, low-sodium ricotta or cottage cheese. Low-fat or nonfat yogurt. Low-fat, low-sodium cheese. Fats and oils Soft margarine without trans fats. Vegetable oil. Reduced-fat, low-fat, or light mayonnaise and salad dressings (reduced-sodium). Canola, safflower, olive, avocado, soybean, and sunflower oils. Avocado. Seasonings and condiments Herbs. Spices. Seasoning mixes without salt. Other foods Unsalted popcorn and pretzels. Fat-free sweets. The items listed above may not be a complete list of foods and beverages you can eat. Contact a dietitian for more information. What foods should I avoid? Fruits Canned fruit in a light or heavy syrup. Fried fruit. Fruit in cream or butter sauce. Vegetables Creamed or fried vegetables. Vegetables in a cheese sauce. Regular canned vegetables (not low-sodium or reduced-sodium). Regular canned tomato sauce and paste (not low-sodium or reduced-sodium). Regular tomato and vegetable juice (not low-sodium or reduced-sodium). Angie Fava. Olives. Grains Baked goods made with fat, such as croissants, muffins, or some  breads. Dry pasta or rice meal packs. Meats and other proteins Fatty cuts of meat. Ribs. Fried meat. Berniece Salines. Bologna, salami, and other precooked or cured meats, such as sausages or meat loaves. Fat from the back of a pig (fatback). Bratwurst. Salted nuts and seeds. Canned beans with added salt. Canned or smoked fish. Whole eggs or egg yolks. Chicken or [place] with skin. Dairy Whole or 2% milk, cream, and half-and-half. Whole or full-fat cream cheese. Whole-fat or sweetened yogurt. Full-fat cheese. Nondairy creamers. Whipped toppings. Processed cheese and cheese spreads. Fats and oils Butter. Stick margarine. Lard. Shortening. Ghee. Bacon fat. Tropical oils, such as coconut, palm kernel, or palm oil. Seasonings and condiments Onion salt, garlic salt, seasoned salt, table salt, and sea salt. Worcestershire sauce. Tartar sauce. Barbecue sauce. Teriyaki sauce. Soy sauce, including reduced-sodium. Steak sauce. Canned and packaged gravies. Fish sauce. Oyster sauce. Cocktail sauce. Store-bought horseradish. Ketchup. Mustard. Meat flavorings and tenderizers. Bouillon cubes. Hot sauces. Pre-made or packaged marinades. Pre-made or packaged taco seasonings. Relishes. Regular salad dressings. Other foods Salted popcorn and pretzels. The items listed above may not be a complete list of foods and beverages you should avoid. Contact a dietitian for more information. Where to find more information National Heart, Lung, and Blood Institute: https://wilson-eaton.com/ American Heart Association: www.heart.org Academy of Nutrition and Dietetics: www.eatright.[place]: www.kidney.org Summary The DASH eating plan is a healthy eating  plan that has been shown to reduce high blood pressure (hypertension). It may also reduce your risk for type 2 diabetes, heart disease, and stroke. When on the DASH eating plan, aim to eat more fresh fruits and vegetables, whole grains, lean proteins, low-fat dairy, and  heart-healthy fats. With the DASH eating plan, you should limit salt (sodium) intake to 2,300 mg a day. If you have hypertension, you may need to reduce your sodium intake to 1,500 mg a day. Work with your health care provider or dietitian to adjust your eating plan to your individual calorie needs. This information is not intended to replace advice given to you by your health care provider. Make sure you discuss any questions you have with your health care provider. Document Revised: 03/19/2019 Document Reviewed: 03/19/2019 Elsevier Patient Education  [place].

## 2022-04-24 ENCOUNTER — Encounter: Payer: Self-pay | Admitting: Nurse Practitioner

## 2022-04-24 ENCOUNTER — Ambulatory Visit (INDEPENDENT_AMBULATORY_CARE_PROVIDER_SITE_OTHER): Payer: PPO | Admitting: Nurse Practitioner

## 2022-04-24 VITALS — BP 136/70 | HR 64 | Temp 97.7°F | Ht 64.02 in | Wt 145.5 lb

## 2022-04-24 DIAGNOSIS — F5101 Primary insomnia: Secondary | ICD-10-CM | POA: Diagnosis not present

## 2022-04-24 DIAGNOSIS — N261 Atrophy of kidney (terminal): Secondary | ICD-10-CM

## 2022-04-24 DIAGNOSIS — M810 Age-related osteoporosis without current pathological fracture: Secondary | ICD-10-CM | POA: Diagnosis not present

## 2022-04-24 DIAGNOSIS — E039 Hypothyroidism, unspecified: Secondary | ICD-10-CM

## 2022-04-24 DIAGNOSIS — I129 Hypertensive chronic kidney disease with stage 1 through stage 4 chronic kidney disease, or unspecified chronic kidney disease: Secondary | ICD-10-CM

## 2022-04-24 DIAGNOSIS — Z0000 Encounter for general adult medical examination without abnormal findings: Secondary | ICD-10-CM | POA: Diagnosis not present

## 2022-04-24 DIAGNOSIS — N1832 Chronic kidney disease, stage 3b: Secondary | ICD-10-CM

## 2022-04-24 DIAGNOSIS — Z23 Encounter for immunization: Secondary | ICD-10-CM

## 2022-04-24 DIAGNOSIS — E875 Hyperkalemia: Secondary | ICD-10-CM

## 2022-04-24 MED ORDER — LEVOTHYROXINE SODIUM 75 MCG PO TABS: 75.0000 ug | ORAL_TABLET | Freq: Every day | ORAL | 4 refills | Status: DC

## 2022-04-24 NOTE — Assessment & Plan Note (Signed)
Chronic, ongoing.  Continue Evista.  Patient declines a repeat DEXA at this time.  Vit D level today.

## 2022-04-24 NOTE — Progress Notes (Signed)
BP 136/70 (BP Location: Left Arm, Patient Position: Sitting, Cuff Size: Normal)   Pulse 64   Temp 97.7 F (36.5 C) (Oral)   Ht 5' 4.02" (1.626 m)   Wt 145 lb 8 oz (66 kg)   LMP  (LMP Unknown)   SpO2 99%   BMI 24.96 kg/m    Subjective:    Patient ID: Sara Cannon, female    DOB: 12-25-1932, 88 y.o.   MRN: [id number]  HPI: Sara Cannon is a 88 y.o. female presenting on 04/24/2022 for comprehensive medical examination and Medicare Wellness. Current medical complaints include:none  She currently lives with: self Menopausal Symptoms: no   HYPOTHYROIDISM Continues on Levothyroxine 75 MCG daily. Thyroid control status:stable Satisfied with current treatment? yes Medication side effects: no Medication compliance: good compliance Etiology of hypothyroidism:  Recent dose adjustment:no Fatigue: no Cold intolerance: no Heat intolerance: no Weight gain: no Weight loss: no Constipation: no Diarrhea/loose stools: no Palpitations: no Lower extremity edema: no Anxiety/depressed mood: no    HYPERTENSION Taking Amlodipine 5 MG daily, nephrology stopped Lisinopril due to high K+ -- they started Veltassa to help lower.  Recent K+ level 4.8. Hypertension status: stable  Satisfied with current treatment? yes Duration of hypertension: chronic BP monitoring frequency:  none BP range:  none BP medication side effects:  no Medication compliance: good compliance Previous BP meds: Aspirin: yes Recurrent headaches: no Visual changes: no Palpitations: no Dyspnea: no Chest pain: no Lower extremity edema: no Dizzy/lightheaded: no    CHRONIC KIDNEY DISEASE Last saw nephrology on 03/26/22 with CRT 1.31, BUN 24, and GFR 39, PTH 104, H/H 11./34.6.   CKD status: stable Medications renally dose: yes Previous renal evaluation: yes Pneumovax:  Up to Date Influenza Vaccine:  Up to Date   OSTEOPOROSIS Currently takes Evista and Vitamin D3.  Last DEXA in 2009, unable to view results  up on Epic.  She does not wish to obtain a repeat DEXA, discussed at length with her. Satisfied with current treatment?: yes Medication side effects: no Medication compliance: good compliance Past osteoporosis medications/treatments:  Adequate calcium & vitamin D: yes Intolerance to bisphosphonates:no Weight bearing exercises: yes   Depression Screen done today and results listed below:     04/24/2022    2:08 PM 06/19/2021    2:08 PM 11/27/2020   11:37 AM 06/13/2020    1:36 PM 12/07/2019    1:04 PM  Depression screen PHQ 2/9  Decreased Interest 0 0 0 0 0  Down, Depressed, Hopeless 0 0 0 0 0  PHQ - 2 Score 0 0 0 0 0  Altered sleeping 0 1   0  Tired, decreased energy 0 0   2  Change in appetite 0 0   0  Feeling bad or failure about yourself  0 0   0  Trouble concentrating 0 0   0  Moving slowly or fidgety/restless 0 0   0  Suicidal thoughts 0 0   0  PHQ-9 Score 0 1   2  Difficult doing work/chores Not difficult at all    Not difficult at all       05/11/2019   12:54 PM 12/07/2019    1:04 PM 06/13/2020    1:36 PM 11/27/2020   11:37 AM 04/24/2022    2:09 PM  [place] in the past year? 0 0 0 0 0  Was there an injury with Fall? 0 0  0 0  Fall Risk Category Calculator 0  0  0 0  Fall Risk Category Low Low  Low Low  Patient Fall Risk Level Low fall risk Low fall risk   Low fall risk  Patient at Risk for Falls Due to  No Fall Risks  No Fall Risks No Fall Risks  Fall risk Follow up Falls evaluation completed Falls evaluation completed  Falls evaluation completed Falls prevention discussed    Functional Status Survey: Is the patient deaf or have difficulty hearing?: No Does the patient have difficulty seeing, even when wearing glasses/contacts?: No Does the patient have difficulty concentrating, remembering, or making decisions?: No Does the patient have difficulty walking or climbing stairs?: No Does the patient have difficulty dressing or bathing?: No Does the patient have  difficulty doing errands alone such as visiting a doctor's office or shopping?: No   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   Basal cell carcinoma    Broken arm    right   Cancer ([place]) 2007   Descending colon, T3, N0. Adjuvant chemotherapy.   Depression    Diverticulitis    Hypertension    Hypertensive chronic kidney disease    Hypothyroidism    Menopausal state    Renal failure May 2015   Dr Candiss Norse    Surgical History:  Past Surgical History:  Procedure Laterality Date   CATARACT EXTRACTION Bilateral    colon resection  2007   COLONOSCOPY  2008, 2011   Dr Bary Castilla, hyperplastic rectal polyps identified May 2008.   COLONOSCOPY WITH PROPOFOL N/A 12/14/2014   Procedure: COLONOSCOPY WITH PROPOFOL;  Surgeon: Robert Bellow, MD;  Location: [facility] ENDOSCOPY;  Service: Endoscopy;  Laterality: N/A;   PORT-A-CATH REMOVAL  2009   PORTACATH PLACEMENT  2007    Medications:  Current Outpatient Medications on File Prior to Visit  Medication Sig   amLODipine (NORVASC) 5 MG tablet Take 5 mg by mouth daily.   aspirin 81 MG tablet Take 81 mg by mouth daily.   Cholecalciferol (D3) 50 MCG (2000 UT) TABS Take by mouth.   Docusate Calcium (STOOL SOFTENER PO) Take 1 capsule by mouth daily.   ferrous sulfate 325 (65 FE) MG EC tablet Take by mouth.   Melatonin 5 MG TABS Take 1 tablet by mouth daily.   raloxifene (EVISTA) 60 MG tablet Take 1 tablet (60 mg total) by mouth daily.   VELTASSA 8.4 g packet Take 8.4 g by mouth daily.   No current facility-administered medications on file prior to visit.    Allergies:  No Known Allergies  Social History:  Social History   Socioeconomic History   Marital status: Single    Spouse name: Not on file   Number of children: 0   Years of education: 1 year college    Highest education level: Some college, no degree  Occupational History   Occupation: retired  Tobacco Use    Smoking status: Never   Smokeless tobacco: Never  Vaping Use   Vaping Use: Never used  Substance and Sexual Activity   Alcohol use: No    Alcohol/week: 0.0 standard drinks of alcohol   Drug use: No   Sexual activity: Never  Other Topics Concern   Not on file  Social History Narrative   Works at [employer] as Psychologist, 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    Social Determinants of Health   Financial Resource Strain: Low Risk  (12/12/2020)   Overall Financial  Resource Strain (CARDIA)    Difficulty of Paying Living Expenses: Not hard at all  Food Insecurity: No Food Insecurity (12/12/2020)   Hunger Vital Sign    Worried About Running Out of Food in the Last Year: Never true    Ran Out of Food in the Last Year: Never true  Transportation Needs: No Transportation Needs (12/12/2020)   PRAPARE - Hydrologist (Medical): No    Lack of Transportation (Non-Medical): No  Physical Activity: Sufficiently Active (12/12/2020)   Exercise Vital Sign    Days of Exercise per Week: 6 days    Minutes of Exercise per Session: 30 min  Stress: No Stress Concern Present (12/12/2020)   [place]    Feeling of Stress : Only a little  Social Connections: Moderately Isolated (12/12/2020)   Social Connection and Isolation Panel [NHANES]    Frequency of Communication with Friends and Family: More than three times a week    Frequency of Social Gatherings with Friends and Family: More than three times a week    Attends Religious Services: More than 4 times per year    Active Member of [employer] or Organizations: No    Attends Archivist Meetings: Never    Marital Status: Widowed  Intimate Partner Violence: Not At Risk (12/12/2020)   Humiliation, Afraid, Rape, and Kick questionnaire    Fear of Current or Ex-Partner: No    Emotionally Abused: No    Physically Abused: No    Sexually Abused: No   Social History    Tobacco Use  Smoking Status Never  Smokeless Tobacco Never   Social History   Substance and Sexual Activity  Alcohol Use No   Alcohol/week: 0.0 standard drinks of alcohol    Family History:  Family History  Problem Relation Age of Onset   Heart disease Mother    Congestive Heart Failure Mother    Hypertension Mother    Leukemia Father    Tremor Brother        x 2, 1 deceased   Tremor Maternal Grandfather     Past medical history, surgical history, medications, allergies, family history and social history reviewed with patient today and changes made to appropriate areas of the chart.   Review of Systems - negative All other ROS negative except what is listed above and in the HPI.      Objective:    BP 136/70 (BP Location: Left Arm, Patient Position: Sitting, Cuff Size: Normal)   Pulse 64   Temp 97.7 F (36.5 C) (Oral)   Ht 5' 4.02" (1.626 m)   Wt 145 lb 8 oz (66 kg)   LMP  (LMP Unknown)   SpO2 99%   BMI 24.96 kg/m   Wt Readings from Last 3 Encounters:  04/24/22 145 lb 8 oz (66 kg)  06/19/21 146 lb (66.2 kg)  12/12/20 147 lb 9.6 oz (67 kg)    Physical Exam Constitutional:      General: She is awake. She is not in acute distress.    Appearance: She is well-developed. She is not ill-appearing.  HENT:     Head: Normocephalic and atraumatic.     Right Ear: Hearing, tympanic membrane, ear canal and external ear normal. No drainage.     Left Ear: Hearing, tympanic membrane, ear canal and external ear normal. No drainage.     Nose: Nose normal.     Right Sinus: No maxillary sinus  tenderness or frontal sinus tenderness.     Left Sinus: No maxillary sinus tenderness or frontal sinus tenderness.     Mouth/Throat:     Mouth: Mucous membranes are moist.     Pharynx: Oropharynx is clear. Uvula midline. No pharyngeal swelling, oropharyngeal exudate or posterior oropharyngeal erythema.  Eyes:     General: Lids are normal.        Right eye: No discharge.        Left  eye: No discharge.     Extraocular Movements: Extraocular movements intact.     Conjunctiva/sclera: Conjunctivae normal.     Pupils: Pupils are equal, round, and reactive to light.     Visual Fields: Right eye visual fields normal and left eye visual fields normal.  Neck:     Thyroid: No thyromegaly.     Vascular: No carotid bruit.     Trachea: Trachea normal.  Cardiovascular:     Rate and Rhythm: Normal rate and regular rhythm.     Heart sounds: Normal heart sounds. No murmur heard.    No gallop.  Pulmonary:     Effort: Pulmonary effort is normal. No accessory muscle usage or respiratory distress.     Breath sounds: Normal breath sounds.  Abdominal:     General: Bowel sounds are normal.     Palpations: Abdomen is soft. There is no hepatomegaly or splenomegaly.     Tenderness: There is no abdominal tenderness.  Musculoskeletal:        General: Normal range of motion.     Cervical back: Normal range of motion and neck supple.     Right lower leg: No edema.     Left lower leg: No edema.  Lymphadenopathy:     Head:     Right side of head: No submental, submandibular, tonsillar, preauricular or posterior auricular adenopathy.     Left side of head: No submental, submandibular, tonsillar, preauricular or posterior auricular adenopathy.     Cervical: No cervical adenopathy.  Skin:    General: Skin is warm and dry.     Capillary Refill: Capillary refill takes less than 2 seconds.     Findings: No rash.  Neurological:     Mental Status: She is alert and oriented to person, place, and time.     Gait: Gait is intact.     Deep Tendon Reflexes: Reflexes are normal and symmetric.     Reflex Scores:      Brachioradialis reflexes are 2+ on the right side and 2+ on the left side.      Patellar reflexes are 2+ on the right side and 2+ on the left side. Psychiatric:        Attention and Perception: Attention normal.        Mood and Affect: Mood normal.        Speech: Speech normal.         Behavior: Behavior normal. Behavior is cooperative.        Thought Content: Thought content normal.        Judgment: Judgment normal.       04/24/2022    2:09 PM 08/26/2018    1:12 PM 08/22/2017   11:28 AM  6CIT Screen  What Year? 0 points 0 points 0 points  What month? 0 points 0 points 0 points  What time? 0 points 0 points 0 points  Count back from 20 0 points 0 points 0 points  Months in reverse 0 points 0 points 0 points  Repeat phrase 0 points 0 points 0 points  Total Score 0 points 0 points 0 points     Results for orders placed or performed in visit on 12/12/20  TSH  Result Value Ref Range   TSH 2.740 0.450 - 4.500 uIU/mL  T4, free  Result Value Ref Range   Free T4 1.55 0.82 - 1.77 ng/dL  VITAMIN D 25 Hydroxy (Vit-D Deficiency, Fractures)  Result Value Ref Range   Vit D, 25-Hydroxy 38.5 30.0 - 100.0 ng/mL      Assessment & Plan:   Problem List Items Addressed This Visit       Endocrine   Hypothyroidism    Chronic, ongoing.  Continue current medication regimen and adjust as needed based on TSH.  Labs: TSH and Free T4 + antibody.  Return in 6 months.      Relevant Medications   levothyroxine (SYNTHROID) 75 MCG tablet   Other Relevant Orders   T4, free   Thyroid peroxidase antibody   TSH     Musculoskeletal and Integument   Osteoporosis    Chronic, ongoing.  Continue Evista.  Patient declines a repeat DEXA at this time.  Vit D level today.      Relevant Orders   VITAMIN D 25 Hydroxy (Vit-D Deficiency, Fractures)   DG Bone Density     Genitourinary   CKD (chronic kidney disease) stage 3, GFR 30-59 ml/min (HCC)    Chronic, ongoing.  Continue collaboration with nephrology.  Labs up to date and reviewed, with nephrology in November.      Hypertensive chronic kidney disease    Chronic, ongoing.  At goal in office today, avoid hypotension.  Continue current medication regimen and collaboration with nephrology.  Labs up to date with nephrology in November.   Recommend continue to monitor BP at home regularly, focus on DASH diet, and avoid nephrotoxic medications.  Return in 6 months.      Relevant Orders   Lipid Panel w/o Chol/HDL Ratio     Other   Hyperkalemia (Chronic)    Ongoing and improving, labs up to date in November with nephrology.      Primary insomnia   Other Visit Diagnoses     Medicare annual wellness visit, subsequent    -  Primary   Due for Medicare Wellness, performed in office with patient today.   Encounter for annual physical exam       Annual physical today with labs and health maintenance reviewed, discussed with patient.        Follow up plan: Return in about 6 months (around 10/24/2022) for HTN, CKD, THYROID, OSTEOPOROSIS.   LABORATORY TESTING:  - Pap smear: not applicable  IMMUNIZATIONS:   - Tdap: Tetanus vaccination status reviewed: refused - Influenza:  refuses - Pneumovax: Up to date - Prevnar: Up to date - HPV: Not applicable - Zostavax vaccine: refused  SCREENING: -Mammogram: Not applicable  - Colonoscopy: Not applicable  - Bone Density: refused -Hearing Test: Not applicable  -Spirometry: Not applicable   PATIENT COUNSELING:   Advised to take 1 mg of folate supplement per day if capable of pregnancy.   Sexuality: Discussed sexually transmitted diseases, partner selection, use of condoms, avoidance of unintended pregnancy  and contraceptive alternatives.   Advised to avoid cigarette smoking.  I discussed with the patient that most people either abstain from alcohol or drink within safe limits (<=14/week and <=4 drinks/occasion for males, <=7/weeks and <= 3 drinks/occasion for females) and that the risk for alcohol  disorders and other health effects rises proportionally with the number of drinks per week and how often a drinker exceeds daily limits.  Discussed cessation/primary prevention of drug use and availability of treatment for abuse.   Diet: Encouraged to adjust caloric intake to  maintain  or achieve ideal body weight, to reduce intake of dietary saturated fat and total fat, to limit sodium intake by avoiding high sodium foods and not adding table salt, and to maintain adequate dietary potassium and calcium preferably from fresh fruits, vegetables, and low-fat dairy products.    Stressed the importance of regular exercise  Injury prevention: Discussed safety belts, safety helmets, smoke detector, smoking near bedding or upholstery.   Dental health: Discussed importance of regular tooth brushing, flossing, and dental visits.    NEXT PREVENTATIVE PHYSICAL DUE IN 1 YEAR. Return in about 6 months (around 10/24/2022) for HTN, CKD, THYROID, OSTEOPOROSIS.

## 2022-04-24 NOTE — Assessment & Plan Note (Signed)
Chronic, ongoing.  Continue current medication regimen and adjust as needed based on TSH.  Labs: TSH and Free T4 + antibody.  Return in 6 months.

## 2022-04-24 NOTE — Assessment & Plan Note (Signed)
Ongoing and improving, labs up to date in November with nephrology.

## 2022-04-24 NOTE — Assessment & Plan Note (Signed)
Chronic, ongoing.  Continue collaboration with nephrology.  Labs up to date and reviewed, with nephrology in November.

## 2022-04-24 NOTE — Assessment & Plan Note (Signed)
Chronic, ongoing.  At goal in office today, avoid hypotension.  Continue current medication regimen and collaboration with nephrology.  Labs up to date with nephrology in November.  Recommend continue to monitor BP at home regularly, focus on DASH diet, and avoid nephrotoxic medications.  Return in 6 months.

## 2022-04-25 LAB — THYROID PEROXIDASE ANTIBODY: Thyroperoxidase Ab SerPl-aCnc: 12 IU/mL (ref 0–34)

## 2022-04-25 LAB — TSH: TSH: 3.39 u[IU]/mL (ref 0.450–4.500)

## 2022-04-25 LAB — LIPID PANEL W/O CHOL/HDL RATIO
Cholesterol, Total: 190 mg/dL (ref 100–199)
HDL: 60 mg/dL (ref >39)
LDL Chol Calc (NIH): 116 mg/dL — ABNORMAL HIGH (ref 0–99)
Triglycerides: 79 mg/dL (ref 0–149)
VLDL Cholesterol Cal: 14 mg/dL (ref 5–40)

## 2022-04-25 LAB — VITAMIN D 25 HYDROXY (VIT D DEFICIENCY, FRACTURES): Vit D, 25-Hydroxy: 42.1 ng/mL (ref 30.0–100.0)

## 2022-04-25 LAB — T4, FREE: Free T4: 1.41 ng/dL (ref 0.82–1.77)

## 2022-04-25 NOTE — Progress Notes (Signed)
Good afternoon, please let Mckenzy know her labs have returned and everything remains stable for her.  No changes needed.  Continue all current medications.  Have a Happy New Year!! Keep being wonderful!!  Thank you for allowing me to participate in your care.  I appreciate you. Kindest regards, Ed Mandich

## 2022-04-26 NOTE — Addendum Note (Signed)
Addended by: Marnee Guarneri T on: 04/26/2022 03:16 PM   Modules accepted: Level of Service

## 2022-06-12 ENCOUNTER — Telehealth: Payer: Self-pay

## 2022-06-13 NOTE — Telephone Encounter (Signed)
Patient verbalized understanding of information given.

## 2022-07-03 ENCOUNTER — Ambulatory Visit: Payer: PPO | Admitting: Podiatry

## 2022-07-03 ENCOUNTER — Encounter: Payer: Self-pay | Admitting: Podiatry

## 2022-07-03 DIAGNOSIS — L603 Nail dystrophy: Secondary | ICD-10-CM | POA: Diagnosis not present

## 2022-07-03 NOTE — Progress Notes (Signed)
She presents today after having not seen her for quite some time with a chief complaint of a discolored hallux nail plate left.  She states that it will know what happened to it he just got darker and the stuff on the side and concerned about.  Objective: Vital signs are stable she is alert and oriented x 3.  Hallux nail left does demonstrate a lateral subungual hematoma most likely secondary to trauma or microtrauma.  Medial border of the hallux left does demonstrate what appears to be some superficial onychomycosis.  With the Dremel I was able to remove that in total.  Assessment: Superficial onychomycosis as well as subungual hematoma or bruise to the nailbed.  Plan: I am going to follow-up with her on an as-needed basis.  Debrided the nail with Dremel tool today.

## 2022-07-24 ENCOUNTER — Other Ambulatory Visit: Payer: Self-pay | Admitting: Nurse Practitioner

## 2022-07-24 NOTE — Telephone Encounter (Signed)
Requested medication (s) are due for refill today:   Yes  Requested medication (s) are on the active medication list:   Yes  Future visit scheduled:   Yes in 3 months   Last ordered: 06/19/2021 #90, 4 refills   Returned because calcium and bone density due per protocol Requested Prescriptions  Pending Prescriptions Disp Refills   raloxifene (EVISTA) 60 MG tablet [Pharmacy Med Name: RALOXIFENE 60MG  TABLETS] 90 tablet 4    Sig: TAKE 1 TABLET(60 MG) BY MOUTH DAILY     OB/GYN: Selective Estrogen Receptor Modulators 2 Failed - 07/24/2022  6:58 AM      Failed - Ca in normal range and within 360 days    Calcium  Date Value Ref Range Status  12/07/2019 8.4 (L) 8.7 - 10.3 mg/dL Final   Calcium, Total  Date Value Ref Range Status  02/28/2014 8.9 8.5 - 10.1 mg/dL Final         Failed - Bone Mineral Density or Dexa Scan completed in the last 2 years      Passed - Valid encounter within last 12 months    Recent Outpatient Visits           3 months ago Medicare annual wellness visit, subsequent   [place], Eden T, NP   1 year ago Hypertensive kidney disease with stage 3b chronic kidney disease ([place])   [place], Henrine Screws T, NP   1 year ago Hypertensive kidney disease with stage 3b chronic kidney disease ([place])   [place] Emma, Barbaraann Faster, NP   1 year ago [place] Vigg, Avanti, MD   2 years ago Hypertensive kidney disease with stage 3b chronic kidney disease ([place])   [place], Barbaraann Faster, NP       Future Appointments             In 3 months [place], Barbaraann Faster, NP [place], [facility]

## 2022-08-12 DIAGNOSIS — N261 Atrophy of kidney (terminal): Secondary | ICD-10-CM | POA: Diagnosis not present

## 2022-08-12 DIAGNOSIS — N1832 Chronic kidney disease, stage 3b: Secondary | ICD-10-CM | POA: Diagnosis not present

## 2022-08-12 DIAGNOSIS — I129 Hypertensive chronic kidney disease with stage 1 through stage 4 chronic kidney disease, or unspecified chronic kidney disease: Secondary | ICD-10-CM | POA: Diagnosis not present

## 2022-08-19 DIAGNOSIS — N261 Atrophy of kidney (terminal): Secondary | ICD-10-CM | POA: Diagnosis not present

## 2022-08-19 DIAGNOSIS — N2581 Secondary hyperparathyroidism of renal origin: Secondary | ICD-10-CM | POA: Diagnosis not present

## 2022-08-19 DIAGNOSIS — D631 Anemia in chronic kidney disease: Secondary | ICD-10-CM | POA: Diagnosis not present

## 2022-08-19 DIAGNOSIS — N1832 Chronic kidney disease, stage 3b: Secondary | ICD-10-CM | POA: Diagnosis not present

## 2022-08-19 DIAGNOSIS — I129 Hypertensive chronic kidney disease with stage 1 through stage 4 chronic kidney disease, or unspecified chronic kidney disease: Secondary | ICD-10-CM | POA: Diagnosis not present

## 2022-08-21 ENCOUNTER — Other Ambulatory Visit: Payer: Self-pay | Admitting: Nurse Practitioner

## 2022-08-21 DIAGNOSIS — Z1231 Encounter for screening mammogram for malignant neoplasm of breast: Secondary | ICD-10-CM

## 2022-09-16 ENCOUNTER — Inpatient Hospital Stay
Admission: RE | Admit: 2022-09-16 | Discharge: 2022-09-16 | Disposition: A | Discharge status: Home / Self Care | Payer: Self-pay | Source: Ambulatory Visit | Attending: Nurse Practitioner | Admitting: Nurse Practitioner

## 2022-09-16 ENCOUNTER — Other Ambulatory Visit: Payer: Self-pay | Admitting: *Deleted

## 2022-09-16 DIAGNOSIS — Z1231 Encounter for screening mammogram for malignant neoplasm of breast: Secondary | ICD-10-CM

## 2022-09-17 ENCOUNTER — Ambulatory Visit
Admission: RE | Admit: 2022-09-17 | Discharge: 2022-09-17 | Disposition: A | Discharge status: Home / Self Care | Payer: PPO | Source: Ambulatory Visit | Attending: Nurse Practitioner | Admitting: Nurse Practitioner

## 2022-09-17 DIAGNOSIS — Z1231 Encounter for screening mammogram for malignant neoplasm of breast: Secondary | ICD-10-CM | POA: Diagnosis not present

## 2022-09-24 DIAGNOSIS — L668 Other cicatricial alopecia: Secondary | ICD-10-CM | POA: Diagnosis not present

## 2022-09-24 DIAGNOSIS — L648 Other androgenic alopecia: Secondary | ICD-10-CM | POA: Diagnosis not present

## 2022-10-08 DIAGNOSIS — Z961 Presence of intraocular lens: Secondary | ICD-10-CM | POA: Diagnosis not present

## 2022-10-08 DIAGNOSIS — H353131 Nonexudative age-related macular degeneration, bilateral, early dry stage: Secondary | ICD-10-CM | POA: Diagnosis not present

## 2022-10-23 ENCOUNTER — Ambulatory Visit: Payer: PPO | Admitting: Nurse Practitioner

## 2022-10-26 NOTE — Patient Instructions (Signed)
Food Basics for Chronic Kidney Disease Chronic kidney disease (CKD) is when your kidneys are not working well. They cannot remove waste, fluids, and other substances from your blood the way they should. These substances can build up, which can worsen kidney damage and affect how your body works. Eating certain foods can lead to a buildup of these substances. Changing your diet can help prevent more kidney damage. Diet changes may also delay dialysis or even keep you from needing it. What nutrients should I limit? Work with your treatment team and a food expert (dietitian) to make a meal plan that's right for you. Foods you can eat and foods you should limit or avoid will depend on the stage of your kidney disease and any other health conditions you have. The items listed below are not a complete list. Talk with your dietitian to learn what is best for you. Potassium Potassium affects how steadily your heart beats. Too much potassium in your blood can cause an irregular heartbeat or even a heart attack. You may need to limit foods that are high in potassium, such as: Liquid milk and soy milk. Salt substitutes that contain potassium. Fruits like bananas, apricots, nectarines, melon, prunes, raisins, kiwi, and oranges. Vegetables, such as potatoes, sweet potatoes, yams, tomatoes, leafy greens, beets, avocado, pumpkin, and winter squash. Beans, like lima beans. Nuts. Phosphorus Phosphorus is a mineral found in your bones. You need a balance between calcium and phosphorus to build and maintain healthy bones. Too much added phosphorus from the foods you eat can pull calcium from your bones. Losing calcium can make your bones weak and more likely to break. Too much phosphorus can also make your skin itch. You may need to limit foods that are high in phosphorus or that have added phosphorus, such as: Liquid milk and dairy products. Dark-colored sodas or soft drinks. Bran cereals and  oatmeal. Protein  Protein helps you make and keep muscle. Protein also helps to repair your body's cells and tissues. One of the natural breakdown products of protein is a waste product called urea. When your kidneys are not working well, they cannot remove types of waste like urea. Reducing protein in your diet can help keep urea from building up in your blood. Depending on your stage of kidney disease, you may need to eat smaller portions of foods that are high in protein. Sources of animal protein include: Meat (all types). Fish and seafood. Poultry. Eggs. Dairy. Other protein foods include: Beans and legumes. Nuts and nut butter. Soy, like tofu.  Sodium Salt (sodium) helps to keep a healthy balance of fluids in your body. Too much salt can increase your blood pressure, which can harm your heart and lungs. Extra salt can also cause your body to keep too much fluid, making your kidneys work harder. You may need to limit or avoid foods that are high in salt, such as: Salt seasonings. Soy and teriyaki sauce. Packaged, precooked, cured, or processed meats, such as sausages or meat loaves. Sardines. Salted crackers and snack foods. Fast food. Canned soups and most canned foods. Pickled foods. Vegetable juice. Boxed mixes or ready-to-eat boxed meals and side dishes. Bottled dressings, sauces, and marinades. Talk with your dietitian about how much potassium, phosphorus, protein, and salt you may have each day. Helpful tips Read food labels  Check the amount of salt in foods. Limit foods that have salt or sodium listed among the first five ingredients. Try to eat low-salt foods. Check the ingredient list   for added phosphorus or potassium. "Phos" in an ingredient is a sign that phosphorus has been added. Do not buy foods that are calcium-enriched or that have calcium added to them (are fortified). Buy canned vegetables and beans that say "no salt added" and rinse them before  eating. Lifestyle Limit the amount of protein you eat from animal sources each day. Focus on protein from plant sources, like tofu and dried beans, peas, and lentils. Do not add salt to food when cooking or before eating. Do not eat star fruit. It can be toxic for people with kidney problems. Talk with your health care provider before taking any vitamin or mineral supplements. If told by your health care provider, track how much liquid you drink so you can avoid drinking too much. You may need to include foods you eat that are made mostly from water, like gelatin, ice cream, soups, and juicy fruits and vegetables. If you have diabetes: If you have diabetes (diabetes mellitus) and CKD, you need to keep your blood sugar (glucose) in the target range recommended by your health care provider. Follow your diabetes management plan. This may include: Checking your blood glucose regularly. Taking medicines by mouth, or taking insulin, or both. Exercising for at least 30 minutes on 5 or more days each week, or as told by your health care provider. Tracking how many servings of carbohydrates you eat at each meal. Not using orange juice to treat low blood sugars. Instead, use apple juice, cranberry juice, or clear soda. You may be given guidelines on what foods and nutrients you may eat, and how much you can have each day. This depends on your stage of kidney disease and whether you have high blood pressure (hypertension). Follow the meal plan your dietitian gives you. To learn more: National Institute of Diabetes and Digestive and Kidney Diseases: niddk.nih.gov National Kidney Foundation: kidney.org Summary Chronic kidney disease (CKD) is when your kidneys are not working well. They cannot remove waste, fluids, and other substances from your blood the way they should. These substances can build up, which can worsen kidney damage and affect how your body works. Changing your diet can help prevent more  kidney damage. Diet changes may also delay dialysis or even keep you from needing it. Diet changes are different for each person with CKD. Work with a dietitian to set up a meal plan that is right for you. This information is not intended to replace advice given to you by your health care provider. Make sure you discuss any questions you have with your health care provider. Document Revised: 08/03/2021 Document Reviewed: 08/09/2019 Elsevier Patient Education  2024 Elsevier Inc.  

## 2022-11-01 ENCOUNTER — Encounter: Payer: Self-pay | Admitting: Nurse Practitioner

## 2022-11-01 ENCOUNTER — Ambulatory Visit (INDEPENDENT_AMBULATORY_CARE_PROVIDER_SITE_OTHER): Payer: PPO | Admitting: Nurse Practitioner

## 2022-11-01 VITALS — BP 134/72 | HR 83 | Temp 97.3°F | Ht 64.02 in | Wt 145.0 lb

## 2022-11-01 DIAGNOSIS — N1832 Chronic kidney disease, stage 3b: Secondary | ICD-10-CM | POA: Diagnosis not present

## 2022-11-01 DIAGNOSIS — I129 Hypertensive chronic kidney disease with stage 1 through stage 4 chronic kidney disease, or unspecified chronic kidney disease: Secondary | ICD-10-CM

## 2022-11-01 DIAGNOSIS — M810 Age-related osteoporosis without current pathological fracture: Secondary | ICD-10-CM

## 2022-11-01 DIAGNOSIS — E875 Hyperkalemia: Secondary | ICD-10-CM | POA: Diagnosis not present

## 2022-11-01 DIAGNOSIS — E039 Hypothyroidism, unspecified: Secondary | ICD-10-CM

## 2022-11-01 MED ORDER — RALOXIFENE HCL 60 MG PO TABS: ORAL_TABLET | ORAL | 4 refills | Status: DC

## 2022-11-01 MED ORDER — LEVOTHYROXINE SODIUM 75 MCG PO TABS: 75.0000 ug | ORAL_TABLET | Freq: Every day | ORAL | 4 refills | Status: DC

## 2022-11-01 NOTE — Progress Notes (Signed)
BP 134/72   Pulse 83   Temp (!) 97.3 F (36.3 C) (Oral)   Ht 5' 4.02" (1.626 m)   Wt 145 lb (65.8 kg)   LMP  (LMP Unknown)   SpO2 97%   BMI 24.88 kg/m    Subjective:    Patient ID: Sara Cannon, female    DOB: 10-12-1932, 89 y.o.   MRN: [id number]  HPI: Sara Cannon is a 89 y.o. female  Chief Complaint  Patient presents with   Hypertension   Chronic Kidney Disease   Osteoporosis   HYPOTHYROIDISM Continues on Levothyroxine 75 MCG daily.  Thyroid labs in December 2023 remained stable. Thyroid control status:stable Satisfied with current treatment? yes Medication side effects: no Medication compliance: good compliance Etiology of hypothyroidism:  Recent dose adjustment:no Fatigue: no Cold intolerance: no Heat intolerance: no Weight gain: no Weight loss: no Constipation: at baseline -- takes stool softener Diarrhea/loose stools: no Palpitations: no Lower extremity edema: no Anxiety/depressed mood: no    HYPERTENSION Taking Amlodipine 5 MG daily.  She remains off Lisinopril due to past elevation in K+ levels and continues Veltassa per nephrology.   Hypertension status: stable  Satisfied with current treatment? yes Duration of hypertension: chronic BP monitoring frequency:  none BP range:  none BP medication side effects:  no Medication compliance: good compliance Previous BP meds: Aspirin: yes Recurrent headaches: no Visual changes: no Palpitations: no Dyspnea: no Chest pain: no Lower extremity edema: no Dizzy/lightheaded: no    CHRONIC KIDNEY DISEASE Last saw nephrology on 08/12/22 with CRT 1.24, BUN 23, and GFR 42, PTH 89, H/H 11.2/34.1 and K+ 4.2. CKD status: stable Medications renally dose: yes Previous renal evaluation: yes Pneumovax:  Up to Date Influenza Vaccine:  Up to Date   OSTEOPOROSIS Currently takes Evista and Vitamin D3.  Last DEXA in 2009, unable to view results in Epic.  She refuses to repeat DEXA, have discussed at length with  her.  No recent falls or fractures.   Satisfied with current treatment?: yes Medication side effects: no Medication compliance: good compliance Past osteoporosis medications/treatments:  Adequate calcium & vitamin D: yes Intolerance to bisphosphonates:no Weight bearing exercises: yes  Relevant past medical, surgical, family and social history reviewed and updated as indicated. Interim medical history since our last visit reviewed. Allergies and medications reviewed and updated.  Review of Systems  Constitutional:  Negative for activity change, appetite change, chills, fatigue and unexpected weight change.  Eyes:  Negative for photophobia and visual disturbance.  Respiratory:  Negative for cough, chest tightness and shortness of breath.   Cardiovascular:  Negative for chest pain, palpitations and leg swelling.  Gastrointestinal:  Positive for constipation (baseline).  Endocrine: Negative for cold intolerance and heat intolerance.  Neurological: Negative.   Psychiatric/Behavioral: Negative.     Per HPI unless specifically indicated above     Objective:    BP 134/72   Pulse 83   Temp (!) 97.3 F (36.3 C) (Oral)   Ht 5' 4.02" (1.626 m)   Wt 145 lb (65.8 kg)   LMP  (LMP Unknown)   SpO2 97%   BMI 24.88 kg/m   Wt Readings from Last 3 Encounters:  11/01/22 145 lb (65.8 kg)  04/24/22 145 lb 8 oz (66 kg)  06/19/21 146 lb (66.2 kg)    Physical Exam Vitals and nursing note reviewed.  Constitutional:      General: She is awake. She is not in acute distress.    Appearance: She is well-developed  and well-groomed. She is not ill-appearing or toxic-appearing.  HENT:     Head: Normocephalic.     Right Ear: Hearing and external ear normal.     Left Ear: Hearing and external ear normal.  Eyes:     General: Lids are normal.        Right eye: No discharge.        Left eye: No discharge.     Conjunctiva/sclera: Conjunctivae normal.     Pupils: Pupils are equal, round, and reactive to  light.  Neck:     Thyroid: No thyromegaly.     Vascular: No carotid bruit.  Cardiovascular:     Rate and Rhythm: Normal rate and regular rhythm.     Heart sounds: Normal heart sounds. No murmur heard.    No gallop.  Pulmonary:     Effort: Pulmonary effort is normal. No accessory muscle usage or respiratory distress.     Breath sounds: Normal breath sounds. No decreased breath sounds, wheezing or rhonchi.  Abdominal:     General: Bowel sounds are normal. There is no distension.     Palpations: Abdomen is soft.     Tenderness: There is no abdominal tenderness.  Musculoskeletal:     Cervical back: Normal range of motion and neck supple.     Right lower leg: No edema.     Left lower leg: No edema.  Lymphadenopathy:     Cervical: No cervical adenopathy.  Skin:    General: Skin is warm and dry.  Neurological:     Mental Status: She is alert and oriented to person, place, and time.     Deep Tendon Reflexes: Reflexes are normal and symmetric.     Reflex Scores:      Brachioradialis reflexes are 2+ on the right side and 2+ on the left side.      Patellar reflexes are 2+ on the right side and 2+ on the left side. Psychiatric:        Attention and Perception: Attention normal.        Mood and Affect: Mood normal.        Speech: Speech normal.        Behavior: Behavior normal. Behavior is cooperative.        Thought Content: Thought content normal.    Results for orders placed or performed in visit on 04/24/22  T4, free  Result Value Ref Range   Free T4 1.41 0.82 - 1.77 ng/dL  Thyroid peroxidase antibody  Result Value Ref Range   Thyroperoxidase Ab SerPl-aCnc 12 0 - 34 IU/mL  TSH  Result Value Ref Range   TSH 3.390 0.450 - 4.500 uIU/mL  Lipid Panel w/o Chol/HDL Ratio  Result Value Ref Range   Cholesterol, Total 190 100 - 199 mg/dL   Triglycerides 79 0 - 149 mg/dL   HDL 60 >86 mg/dL   VLDL Cholesterol Cal 14 5 - 40 mg/dL   LDL Chol Calc (NIH) 578 (H) 0 - 99 mg/dL  VITAMIN D  25 Hydroxy (Vit-D Deficiency, Fractures)  Result Value Ref Range   Vit D, 25-Hydroxy 42.1 30.0 - 100.0 ng/mL      Assessment & Plan:   Problem List Items Addressed This Visit       Endocrine   Hypothyroidism    Chronic, ongoing.  Continue current medication regimen and adjust as needed based on TSH.  Labs: up to date -- repeat at next visit and annually.  Return in 6 months.  Relevant Medications   levothyroxine (SYNTHROID) 75 MCG tablet     Musculoskeletal and Integument   Osteoporosis    Chronic, ongoing.  Continue Evista.  Patient declines a repeat DEXA at this time.  Vit D level next visit.      Relevant Medications   raloxifene (EVISTA) 60 MG tablet     Genitourinary   CKD (chronic kidney disease) stage 3, GFR 30-59 ml/min (HCC) - Primary    Chronic, ongoing.  Continue collaboration with nephrology.  Labs up to date and reviewed, with nephrology in April.  She returns in August.  Will continue to review their notes and labs.      Hypertensive chronic kidney disease    Chronic, stable.  BP at goal in office.  Continue current medication regimen and collaboration with nephrology.  Labs up to date with nephrology in April.  Recommend continue to monitor BP at home regularly, focus on DASH diet, and avoid nephrotoxic medications.  Return in 6 months.        Other   Hyperkalemia (Chronic)    Ongoing and stable, labs up to date in April with nephrology.  Continue Veltassa per nephrology.        Follow up plan: Return in about 6 months (around 05/04/2023) for HTN, CKD, OSTEOPOROSIS, HYPOTHYROID.

## 2022-11-01 NOTE — Assessment & Plan Note (Signed)
Chronic, stable.  BP at goal in office.  Continue current medication regimen and collaboration with nephrology.  Labs up to date with nephrology in April.  Recommend continue to monitor BP at home regularly, focus on DASH diet, and avoid nephrotoxic medications.  Return in 6 months.

## 2022-11-01 NOTE — Assessment & Plan Note (Signed)
Chronic, ongoing.  Continue Evista.  Patient declines a repeat DEXA at this time.  Vit D level next visit. 

## 2022-11-01 NOTE — Assessment & Plan Note (Signed)
Chronic, ongoing.  Continue collaboration with nephrology.  Labs up to date and reviewed, with nephrology in April.  She returns in August.  Will continue to review their notes and labs.

## 2022-11-01 NOTE — Assessment & Plan Note (Signed)
Ongoing and stable, labs up to date in April with nephrology.  Continue Veltassa per nephrology.

## 2022-11-01 NOTE — Assessment & Plan Note (Signed)
Chronic, ongoing.  Continue current medication regimen and adjust as needed based on TSH.  Labs: up to date -- repeat at next visit and annually.  Return in 6 months.

## 2022-12-17 DIAGNOSIS — N2581 Secondary hyperparathyroidism of renal origin: Secondary | ICD-10-CM | POA: Diagnosis not present

## 2022-12-17 DIAGNOSIS — N261 Atrophy of kidney (terminal): Secondary | ICD-10-CM | POA: Diagnosis not present

## 2022-12-17 DIAGNOSIS — I129 Hypertensive chronic kidney disease with stage 1 through stage 4 chronic kidney disease, or unspecified chronic kidney disease: Secondary | ICD-10-CM | POA: Diagnosis not present

## 2022-12-17 DIAGNOSIS — D631 Anemia in chronic kidney disease: Secondary | ICD-10-CM | POA: Diagnosis not present

## 2022-12-17 DIAGNOSIS — N1832 Chronic kidney disease, stage 3b: Secondary | ICD-10-CM | POA: Diagnosis not present

## 2022-12-24 DIAGNOSIS — I10 Essential (primary) hypertension: Secondary | ICD-10-CM | POA: Diagnosis not present

## 2022-12-24 DIAGNOSIS — I129 Hypertensive chronic kidney disease with stage 1 through stage 4 chronic kidney disease, or unspecified chronic kidney disease: Secondary | ICD-10-CM | POA: Diagnosis not present

## 2022-12-24 DIAGNOSIS — N1832 Chronic kidney disease, stage 3b: Secondary | ICD-10-CM | POA: Diagnosis not present

## 2022-12-24 DIAGNOSIS — N2581 Secondary hyperparathyroidism of renal origin: Secondary | ICD-10-CM | POA: Diagnosis not present

## 2022-12-24 DIAGNOSIS — D631 Anemia in chronic kidney disease: Secondary | ICD-10-CM | POA: Diagnosis not present

## 2022-12-24 DIAGNOSIS — N261 Atrophy of kidney (terminal): Secondary | ICD-10-CM | POA: Diagnosis not present

## 2023-04-25 ENCOUNTER — Telehealth: Payer: Self-pay | Admitting: Nurse Practitioner

## 2023-04-25 NOTE — Telephone Encounter (Signed)
Copied from CRM [id number]. Topic: Medicare AWV >> Apr 25, 2023  2:59 PM Payton Doughty wrote: Reason for CRM: Called LVM 04/25/2023 to schedule AWV. Please schedule office or virtual visits.  Verlee Rossetti; Care Guide Ambulatory Clinical Support [place] [facility] Direct Dial: [phone]

## 2023-05-02 NOTE — Patient Instructions (Signed)
 Be Involved in Caring For Your Health:  Taking Medications When medications are taken as directed, they can greatly improve your health. But if they are not taken as prescribed, they may not work. In some cases, not taking them correctly can be harmful. To help ensure your treatment remains effective and safe, understand your medications and how to take them. Bring your medications to each visit for review by your provider.  Your lab results, notes, and after visit summary will be available on My Chart. We strongly encourage you to use this feature. If lab results are abnormal the clinic will contact you with the appropriate steps. If the clinic does not contact you assume the results are satisfactory. You can always view your results on My Chart. If you have questions regarding your health or results, please contact the clinic during office hours. You can also ask questions on My Chart.  We at [facility] are grateful that you chose [place] to provide your care. We strive to provide evidence-based and compassionate care and are always looking for feedback. If you get a survey from the clinic please complete this so we can hear your opinions.  Heart-Healthy Eating Plan Many factors influence your heart health, including eating and exercise habits. Heart health is also called coronary health. Coronary risk increases with abnormal blood fat (lipid) levels. A heart-healthy eating plan includes limiting unhealthy fats, increasing healthy fats, limiting salt (sodium) intake, and making other diet and lifestyle changes. What is my plan? Your health care provider may recommend that: You limit your fat intake to _________% or less of your total calories each day. You limit your saturated fat intake to _________% or less of your total calories each day. You limit the amount of cholesterol in your diet to less than _________ mg per day. You limit the amount of sodium in your diet to less than _________  mg per day. What are tips for following this plan? Cooking Cook foods using methods other than frying. Baking, boiling, grilling, and broiling are all good options. Other ways to reduce fat include: Removing the skin from poultry. Removing all visible fats from meats. Steaming vegetables in water or broth. Meal planning  At meals, imagine dividing your plate into fourths: Fill one-half of your plate with vegetables and green salads. Fill one-fourth of your plate with whole grains. Fill one-fourth of your plate with lean protein foods. Eat 2-4 cups of vegetables per day. One cup of vegetables equals 1 cup (91 g) broccoli or cauliflower florets, 2 medium carrots, 1 large bell pepper, 1 large sweet potato, 1 large tomato, 1 medium white potato, 2 cups (150 g) raw leafy greens. Eat 1-2 cups of fruit per day. One cup of fruit equals 1 small apple, 1 large banana, 1 cup (237 g) mixed fruit, 1 large orange,  cup (82 g) dried fruit, 1 cup (240 mL) 100% fruit juice. Eat more foods that contain soluble fiber. Examples include apples, broccoli, carrots, beans, peas, and barley. Aim to get 25-30 g of fiber per day. Increase your consumption of legumes, nuts, and seeds to 4-5 servings per week. One serving of dried beans or legumes equals  cup (90 g) cooked, 1 serving of nuts is  oz (12 almonds, 24 pistachios, or 7 walnut halves), and 1 serving of seeds equals  oz (8 g). Fats Choose healthy fats more often. Choose monounsaturated and polyunsaturated fats, such as olive and canola oils, avocado oil, flaxseeds, walnuts, almonds, and seeds. Eat  more omega-3 fats. Choose salmon, mackerel, sardines, tuna, flaxseed oil, and ground flaxseeds. Aim to eat fish at least 2 times each week. Check food labels carefully to identify foods with trans fats or high amounts of saturated fat. Limit saturated fats. These are found in animal products, such as meats, butter, and cream. Plant sources of saturated fats  include palm oil, palm kernel oil, and coconut oil. Avoid foods with partially hydrogenated oils in them. These contain trans fats. Examples are stick margarine, some tub margarines, cookies, crackers, and other baked goods. Avoid fried foods. General information Eat more home-cooked food and less restaurant, buffet, and fast food. Limit or avoid alcohol. Limit foods that are high in added sugar and simple starches such as foods made using white refined flour (white breads, pastries, sweets). Lose weight if you are overweight. Losing just 5-10% of your body weight can help your overall health and prevent diseases such as diabetes and heart disease. Monitor your sodium intake, especially if you have high blood pressure. Talk with your health care provider about your sodium intake. Try to incorporate more vegetarian meals weekly. What foods should I eat? Fruits All fresh, canned (in natural juice), or frozen fruits. Vegetables Fresh or frozen vegetables (raw, steamed, roasted, or grilled). Green salads. Grains Most grains. Choose whole wheat and whole grains most of the time. Rice and pasta, including brown rice and pastas made with whole wheat. Meats and other proteins Lean, well-trimmed beef, veal, pork, and lamb. Chicken and [place] without skin. All fish and shellfish. Wild duck, rabbit, pheasant, and venison. Egg whites or low-cholesterol egg substitutes. Dried beans, peas, lentils, and tofu. Seeds and most nuts. Dairy Low-fat or nonfat cheeses, including ricotta and mozzarella. Skim or 1% milk (liquid, powdered, or evaporated). Buttermilk made with low-fat milk. Nonfat or low-fat yogurt. Fats and oils Non-hydrogenated (trans-free) margarines. Vegetable oils, including soybean, sesame, sunflower, olive, avocado, peanut, safflower, corn, canola, and cottonseed. Salad dressings or mayonnaise made with a vegetable oil. Beverages Water (mineral or sparkling). Coffee and tea. Unsweetened ice  tea. Diet beverages. Sweets and desserts Sherbet, gelatin, and fruit ice. Small amounts of dark chocolate. Limit all sweets and desserts. Seasonings and condiments All seasonings and condiments. The items listed above may not be a complete list of foods and beverages you can eat. Contact a dietitian for more options. What foods should I avoid? Fruits Canned fruit in heavy syrup. Fruit in cream or butter sauce. Fried fruit. Limit coconut. Vegetables Vegetables cooked in cheese, cream, or butter sauce. Fried vegetables. Grains Breads made with saturated or trans fats, oils, or whole milk. Croissants. Sweet rolls. Donuts. High-fat crackers, such as cheese crackers and chips. Meats and other proteins Fatty meats, such as hot dogs, ribs, sausage, bacon, rib-eye roast or steak. High-fat deli meats, such as salami and bologna. Caviar. Domestic duck and goose. Organ meats, such as liver. Dairy Cream, sour cream, cream cheese, and creamed cottage cheese. Whole-milk cheeses. Whole or 2% milk (liquid, evaporated, or condensed). Whole buttermilk. Cream sauce or high-fat cheese sauce. Whole-milk yogurt. Fats and oils Meat fat, or shortening. Cocoa butter, hydrogenated oils, palm oil, coconut oil, palm kernel oil. Solid fats and shortenings, including bacon fat, salt pork, lard, and butter. Nondairy cream substitutes. Salad dressings with cheese or sour cream. Beverages Regular sodas and any drinks with added sugar. Sweets and desserts Frosting. Pudding. Cookies. Cakes. Pies. Milk chocolate or white chocolate. Buttered syrups. Full-fat ice cream or ice cream drinks. The items listed above may  not be a complete list of foods and beverages to avoid. Contact a dietitian for more information. Summary Heart-healthy meal planning includes limiting unhealthy fats, increasing healthy fats, limiting salt (sodium) intake and making other diet and lifestyle changes. Lose weight if you are overweight. Losing just  5-10% of your body weight can help your overall health and prevent diseases such as diabetes and heart disease. Focus on eating a balance of foods, including fruits and vegetables, low-fat or nonfat dairy, lean protein, nuts and legumes, whole grains, and heart-healthy oils and fats. This information is not intended to replace advice given to you by your health care provider. Make sure you discuss any questions you have with your health care provider. Document Revised: 05/21/2021 Document Reviewed: 05/21/2021 Elsevier Patient Education  2024 [employer].

## 2023-05-06 DIAGNOSIS — I129 Hypertensive chronic kidney disease with stage 1 through stage 4 chronic kidney disease, or unspecified chronic kidney disease: Secondary | ICD-10-CM | POA: Diagnosis not present

## 2023-05-06 DIAGNOSIS — D631 Anemia in chronic kidney disease: Secondary | ICD-10-CM | POA: Diagnosis not present

## 2023-05-06 DIAGNOSIS — N1832 Chronic kidney disease, stage 3b: Secondary | ICD-10-CM | POA: Diagnosis not present

## 2023-05-06 DIAGNOSIS — N261 Atrophy of kidney (terminal): Secondary | ICD-10-CM | POA: Diagnosis not present

## 2023-05-06 DIAGNOSIS — I10 Essential (primary) hypertension: Secondary | ICD-10-CM | POA: Diagnosis not present

## 2023-05-06 DIAGNOSIS — N2581 Secondary hyperparathyroidism of renal origin: Secondary | ICD-10-CM | POA: Diagnosis not present

## 2023-05-07 ENCOUNTER — Encounter: Payer: Self-pay | Admitting: Nurse Practitioner

## 2023-05-07 ENCOUNTER — Ambulatory Visit: Payer: PPO | Admitting: Nurse Practitioner

## 2023-05-07 VITALS — BP 135/73 | HR 78 | Temp 97.6°F | Ht 64.2 in | Wt 147.6 lb

## 2023-05-07 DIAGNOSIS — M810 Age-related osteoporosis without current pathological fracture: Secondary | ICD-10-CM | POA: Diagnosis not present

## 2023-05-07 DIAGNOSIS — N1832 Chronic kidney disease, stage 3b: Secondary | ICD-10-CM

## 2023-05-07 DIAGNOSIS — E039 Hypothyroidism, unspecified: Secondary | ICD-10-CM | POA: Diagnosis not present

## 2023-05-07 DIAGNOSIS — Z23 Encounter for immunization: Secondary | ICD-10-CM

## 2023-05-07 DIAGNOSIS — E875 Hyperkalemia: Secondary | ICD-10-CM | POA: Diagnosis not present

## 2023-05-07 DIAGNOSIS — F5101 Primary insomnia: Secondary | ICD-10-CM

## 2023-05-07 DIAGNOSIS — Z0000 Encounter for general adult medical examination without abnormal findings: Secondary | ICD-10-CM | POA: Diagnosis not present

## 2023-05-07 DIAGNOSIS — I129 Hypertensive chronic kidney disease with stage 1 through stage 4 chronic kidney disease, or unspecified chronic kidney disease: Secondary | ICD-10-CM

## 2023-05-07 NOTE — Assessment & Plan Note (Signed)
 Ongoing and stable, labs up to date with nephrology.  Continue Veltassa per nephrology.

## 2023-05-07 NOTE — Assessment & Plan Note (Signed)
Chronic, ongoing.  Continue Evista.  Patient declines a repeat DEXA at this time.  Vit D level today. 

## 2023-05-07 NOTE — Assessment & Plan Note (Signed)
 Chronic, ongoing.  Continue collaboration with nephrology.  Labs up to date and reviewed. Will continue to review their notes and labs from nephrology team.

## 2023-05-07 NOTE — Assessment & Plan Note (Signed)
 Chronic, stable.  BP at goal in office.  Continue current medication regimen and collaboration with nephrology.  Labs up to date with nephrology.  Recommend continue to monitor BP at home regularly, focus on DASH diet, and avoid nephrotoxic medications.  Return in 6 months.

## 2023-05-07 NOTE — Assessment & Plan Note (Signed)
 Chronic, ongoing.  Continue current medication regimen and adjust as needed based on TSH.  Labs: obtain today and annually unless symptoms.  Return in 6 months.

## 2023-05-07 NOTE — Progress Notes (Signed)
 BP 135/73 (BP Location: Left Arm, Cuff Size: Normal)   Pulse 78   Temp 97.6 F (36.4 C) (Oral)   Ht 5' 4.2 (1.631 m)   Wt 147 lb 9.6 oz (67 kg)   LMP  (LMP Unknown)   SpO2 98%   BMI 25.18 kg/m    Subjective:    Patient ID: Sara Cannon, female    DOB: 22-Mar-1933, 89 y.o.   MRN: [id number]  HPI: Sara Cannon is a 89 y.o. female presenting on 05/07/2023 for Medicare Wellness and follow-up. Current medical complaints include:none  She currently lives with: self Menopausal Symptoms: no   HYPOTHYROIDISM Continues on Levothyroxine  75 MCG daily. Thyroid  control status:stable Satisfied with current treatment? yes Medication side effects: no Medication compliance: good compliance Etiology of hypothyroidism:  Recent dose adjustment:no Fatigue: no Cold intolerance: no Heat intolerance: no Weight gain: no Weight loss: no Constipation: no Diarrhea/loose stools: no Palpitations: no Lower extremity edema: no Anxiety/depressed mood: no    HYPERTENSION Taking Amlodipine 5 MG daily and Veltassa. Hypertension status: stable  Satisfied with current treatment? yes Duration of hypertension: chronic BP monitoring frequency:  none BP range:  none BP medication side effects:  no Medication compliance: good compliance Previous BP meds: Aspirin: yes Recurrent headaches: no Visual changes: no Palpitations: no Dyspnea: no Chest pain: no Lower extremity edema: no Dizzy/lightheaded: no    CHRONIC KIDNEY DISEASE Sees nephrology on Tuesday, labs drawn yesterday: CRT 1.23, eGFR 42, K+ 4.4. CKD status: stable Medications renally dose: yes Previous renal evaluation: yes Pneumovax:  Up to Date Influenza Vaccine:  Up to Date   OSTEOPOROSIS Takes Evista  and Vitamin D3.  DEXA last in 2009, unable to view results up on Epic.  She does not wish to obtain a repeat DEXA.  She continues to be very active daily with Silver Sneakers and no recent falls. Satisfied with current  treatment?: yes Medication side effects: no Medication compliance: good compliance Past osteoporosis medications/treatments:  Adequate calcium & vitamin D : yes Intolerance to bisphosphonates:no Weight bearing exercises: yes   Depression Screen done today and results listed below:     05/07/2023    1:46 PM 04/24/2022    2:08 PM 06/19/2021    2:08 PM 11/27/2020   11:37 AM 06/13/2020    1:36 PM  Depression screen PHQ 2/9  Decreased Interest 0 0 0 0 0  Down, Depressed, Hopeless 0 0 0 0 0  PHQ - 2 Score 0 0 0 0 0  Altered sleeping 0 0 1    Tired, decreased energy 0 0 0    Change in appetite 0 0 0    Feeling bad or failure about yourself  0 0 0    Trouble concentrating 0 0 0    Moving slowly or fidgety/restless 0 0 0    Suicidal thoughts 0 0 0    PHQ-9 Score 0 0 1    Difficult doing work/chores Not difficult at all Not difficult at all         05/07/2023    1:46 PM 04/24/2022    2:08 PM  GAD 7 : Generalized Anxiety Score  Nervous, Anxious, on Edge 0 0  Control/stop worrying 0 0  Worry too much - different things 0 0  Trouble relaxing 0 0  Restless 0 0  Easily annoyed or irritable 0 0  Afraid - awful might happen 0 0  Total GAD 7 Score 0 0  Anxiety Difficulty Not difficult at all Not difficult  at all      12/07/2019    1:04 PM 06/13/2020    1:36 PM 11/27/2020   11:37 AM 04/24/2022    2:09 PM 05/07/2023    1:46 PM  Fall Risk  Falls in the past year? 0 0 0 0 0  Was there an injury with Fall? 0  0 0 0  Fall Risk Category Calculator 0  0 0 0  Fall Risk Category (Retired) Low  Low Low   (RETIRED) Patient Fall Risk Level Low fall risk   Low fall risk   Patient at Risk for Falls Due to No Fall Risks  No Fall Risks No Fall Risks No Fall Risks  Fall risk Follow up Falls evaluation completed  Falls evaluation completed Falls prevention discussed Falls evaluation completed    Functional Status Survey: Is the patient deaf or have difficulty hearing?: No Does the patient have difficulty  seeing, even when wearing glasses/contacts?: No Does the patient have difficulty concentrating, remembering, or making decisions?: No Does the patient have difficulty walking or climbing stairs?: No Does the patient have difficulty dressing or bathing?: No Does the patient have difficulty doing errands alone such as visiting a doctor's office or shopping?: No   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   Basal cell carcinoma    Broken arm    right   Cancer (HCC) 2007   Descending colon, T3, N0. Adjuvant chemotherapy.   Depression    Diverticulitis    Hypertension    Hypertensive chronic kidney disease    Hypothyroidism    Menopausal state    Renal failure May 2015   Dr Dennise    Surgical History:  Past Surgical History:  Procedure Laterality Date   CATARACT EXTRACTION Bilateral    colon resection  2007   COLONOSCOPY  2008, 2011   Dr Dessa, hyperplastic rectal polyps identified May 2008.   COLONOSCOPY WITH PROPOFOL  N/A 12/14/2014   Procedure: COLONOSCOPY WITH PROPOFOL ;  Surgeon: Reyes LELON Dessa, MD;  Location: [facility] ENDOSCOPY;  Service: Endoscopy;  Laterality: N/A;   PORT-A-CATH REMOVAL  2009   PORTACATH PLACEMENT  2007    Medications:  Current Outpatient Medications on File Prior to Visit  Medication Sig   amLODipine (NORVASC) 5 MG tablet Take 5 mg by mouth daily.   aspirin 81 MG tablet Take 81 mg by mouth daily.   Cholecalciferol (D3) 50 MCG (2000 UT) TABS Take by mouth.   Docusate Calcium (STOOL SOFTENER PO) Take 1 capsule by mouth daily.   ferrous sulfate 325 (65 FE) MG EC tablet Take by mouth.   levothyroxine  (SYNTHROID ) 75 MCG tablet Take 1 tablet (75 mcg total) by mouth daily.   Melatonin 5 MG TABS Take 1 tablet by mouth daily.   raloxifene  (EVISTA ) 60 MG tablet TAKE 1 TABLET(60 MG) BY MOUTH DAILY   VELTASSA 8.4 g packet Take 8.4 g by mouth daily.   No current facility-administered  medications on file prior to visit.    Allergies:  No Known Allergies  Social History:  Social History   Socioeconomic History   Marital status: Single    Spouse name: Not on file   Number of children: 0   Years of education: 1 year college    Highest education level: Some college, no degree  Occupational History   Occupation: retired  Tobacco Use   Smoking status: Never   Smokeless tobacco:  Never  Vaping Use   Vaping status: Never Used  Substance and Sexual Activity   Alcohol use: No    Alcohol/week: 0.0 standard drinks of alcohol   Drug use: No   Sexual activity: Never  Other Topics Concern   Not on file  Social History Narrative   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    Social Drivers of Corporate Investment Banker Strain: Low Risk  (05/07/2023)   Overall Financial Resource Strain (CARDIA)    Difficulty of Paying Living Expenses: Not hard at all  Food Insecurity: No Food Insecurity (05/07/2023)   Hunger Vital Sign    Worried About Running Out of Food in the Last Year: Never true    Ran Out of Food in the Last Year: Never true  Transportation Needs: No Transportation Needs (05/07/2023)   PRAPARE - Administrator, Civil Service (Medical): No    Lack of Transportation (Non-Medical): No  Physical Activity: Sufficiently Active (05/07/2023)   Exercise Vital Sign    Days of Exercise per Week: 5 days    Minutes of Exercise per Session: 40 min  Stress: No Stress Concern Present (05/07/2023)   [employer] of Occupational Health - Occupational Stress Questionnaire    Feeling of Stress : Not at all  Social Connections: Moderately Isolated (05/07/2023)   Social Connection and Isolation Panel [NHANES]    Frequency of Communication with Friends and Family: More than three times a week    Frequency of Social Gatherings with Friends and Family: More than three times a week    Attends Religious Services: More than 4 times per year    Active Member  of [employer] or Organizations: No    Attends Banker Meetings: Never    Marital Status: Widowed  Intimate Partner Violence: Not At Risk (05/07/2023)   Humiliation, Afraid, Rape, and Kick questionnaire    Fear of Current or Ex-Partner: No    Emotionally Abused: No    Physically Abused: No    Sexually Abused: No   Social History   Tobacco Use  Smoking Status Never  Smokeless Tobacco Never   Social History   Substance and Sexual Activity  Alcohol Use No   Alcohol/week: 0.0 standard drinks of alcohol    Family History:  Family History  Problem Relation Age of Onset   Heart disease Mother    Congestive Heart Failure Mother    Hypertension Mother    Leukemia Father    Tremor Brother        x 2, 1 deceased   Tremor Maternal Grandfather     Past medical history, surgical history, medications, allergies, family history and social history reviewed with patient today and changes made to appropriate areas of the chart.   Review of Systems - negative All other ROS negative except what is listed above and in the HPI.      Objective:    BP 135/73 (BP Location: Left Arm, Cuff Size: Normal)   Pulse 78   Temp 97.6 F (36.4 C) (Oral)   Ht 5' 4.2 (1.631 m)   Wt 147 lb 9.6 oz (67 kg)   LMP  (LMP Unknown)   SpO2 98%   BMI 25.18 kg/m   Wt Readings from Last 3 Encounters:  05/07/23 147 lb 9.6 oz (67 kg)  11/01/22 145 lb (65.8 kg)  04/24/22 145 lb 8 oz (66 kg)    Physical Exam Constitutional:  General: She is awake. She is not in acute distress.    Appearance: She is well-developed. She is not ill-appearing.  HENT:     Head: Normocephalic and atraumatic.     Right Ear: Hearing, tympanic membrane, ear canal and external ear normal. No drainage.     Left Ear: Hearing, tympanic membrane, ear canal and external ear normal. No drainage.     Nose: Nose normal.     Right Sinus: No maxillary sinus tenderness or frontal sinus tenderness.     Left Sinus: No maxillary  sinus tenderness or frontal sinus tenderness.     Mouth/Throat:     Mouth: Mucous membranes are moist.     Pharynx: Oropharynx is clear. Uvula midline. No pharyngeal swelling, oropharyngeal exudate or posterior oropharyngeal erythema.  Eyes:     General: Lids are normal.        Right eye: No discharge.        Left eye: No discharge.     Extraocular Movements: Extraocular movements intact.     Conjunctiva/sclera: Conjunctivae normal.     Pupils: Pupils are equal, round, and reactive to light.     Visual Fields: Right eye visual fields normal and left eye visual fields normal.  Neck:     Thyroid : No thyromegaly.     Vascular: No carotid bruit.     Trachea: Trachea normal.  Cardiovascular:     Rate and Rhythm: Normal rate and regular rhythm.     Heart sounds: Normal heart sounds. No murmur heard.    No gallop.  Pulmonary:     Effort: Pulmonary effort is normal. No accessory muscle usage or respiratory distress.     Breath sounds: Normal breath sounds.  Abdominal:     General: Bowel sounds are normal.     Palpations: Abdomen is soft. There is no hepatomegaly or splenomegaly.     Tenderness: There is no abdominal tenderness.  Musculoskeletal:        General: Normal range of motion.     Cervical back: Normal range of motion and neck supple.     Right lower leg: No edema.     Left lower leg: No edema.  Lymphadenopathy:     Head:     Right side of head: No submental, submandibular, tonsillar, preauricular or posterior auricular adenopathy.     Left side of head: No submental, submandibular, tonsillar, preauricular or posterior auricular adenopathy.     Cervical: No cervical adenopathy.  Skin:    General: Skin is warm and dry.     Capillary Refill: Capillary refill takes less than 2 seconds.     Findings: No rash.  Neurological:     Mental Status: She is alert and oriented to person, place, and time.     Gait: Gait is intact.     Deep Tendon Reflexes: Reflexes are normal and  symmetric.     Reflex Scores:      Brachioradialis reflexes are 2+ on the right side and 2+ on the left side.      Patellar reflexes are 2+ on the right side and 2+ on the left side. Psychiatric:        Attention and Perception: Attention normal.        Mood and Affect: Mood normal.        Speech: Speech normal.        Behavior: Behavior normal. Behavior is cooperative.        Thought Content: Thought content normal.  Judgment: Judgment normal.       05/07/2023    2:26 PM 04/24/2022    2:09 PM 08/26/2018    1:12 PM 08/22/2017   11:28 AM  6CIT Screen  What Year? 0 points 0 points 0 points 0 points  What month? 0 points 0 points 0 points 0 points  What time? 0 points 0 points 0 points 0 points  Count back from 20 0 points 0 points 0 points 0 points  Months in reverse 0 points 0 points 0 points 0 points  Repeat phrase 0 points 0 points 0 points 0 points  Total Score 0 points 0 points 0 points 0 points     Results for orders placed or performed in visit on 04/24/22  T4, free   Collection Time: 04/24/22  2:19 PM  Result Value Ref Range   Free T4 1.41 0.82 - 1.77 ng/dL  Thyroid  peroxidase antibody   Collection Time: 04/24/22  2:19 PM  Result Value Ref Range   Thyroperoxidase Ab SerPl-aCnc 12 0 - 34 IU/mL  TSH   Collection Time: 04/24/22  2:19 PM  Result Value Ref Range   TSH 3.390 0.450 - 4.500 uIU/mL  Lipid Panel w/o Chol/HDL Ratio   Collection Time: 04/24/22  2:19 PM  Result Value Ref Range   Cholesterol, Total 190 100 - 199 mg/dL   Triglycerides 79 0 - 149 mg/dL   HDL 60 >60 mg/dL   VLDL Cholesterol Cal 14 5 - 40 mg/dL   LDL Chol Calc (NIH) 883 (H) 0 - 99 mg/dL  VITAMIN D  25 Hydroxy (Vit-D Deficiency, Fractures)   Collection Time: 04/24/22  2:19 PM  Result Value Ref Range   Vit D, 25-Hydroxy 42.1 30.0 - 100.0 ng/mL      Assessment & Plan:   Problem List Items Addressed This Visit       Endocrine   Hypothyroidism   Chronic, ongoing.  Continue current  medication regimen and adjust as needed based on TSH.  Labs: obtain today and annually unless symptoms.  Return in 6 months.      Relevant Orders   T4, free   TSH     Musculoskeletal and Integument   Osteoporosis   Chronic, ongoing.  Continue Evista .  Patient declines a repeat DEXA at this time.  Vit D level today.      Relevant Orders   VITAMIN D  25 Hydroxy (Vit-D Deficiency, Fractures)     Genitourinary   CKD (chronic kidney disease) stage 3, GFR 30-59 ml/min (HCC)   Chronic, ongoing.  Continue collaboration with nephrology.  Labs up to date and reviewed. Will continue to review their notes and labs from nephrology team.      Hypertensive chronic kidney disease   Chronic, stable.  BP at goal in office.  Continue current medication regimen and collaboration with nephrology.  Labs up to date with nephrology.  Recommend continue to monitor BP at home regularly, focus on DASH diet, and avoid nephrotoxic medications.  Return in 6 months.        Other   Hyperkalemia (Chronic)   Ongoing and stable, labs up to date with nephrology.  Continue Veltassa per nephrology.      Other Visit Diagnoses       Medicare annual wellness visit, subsequent    -  Primary   Medicare Wellness due and performed with patient today.        Follow up plan: Return in about 6 months (around 11/04/2023) for HTN/HLD, CKD,  Thyroid .   LABORATORY TESTING:  - Pap smear: not applicable  IMMUNIZATIONS:   - Tdap: Tetanus vaccination status reviewed: refused - Influenza:  refuses - Pneumovax: Up to date - Prevnar: Up to date - HPV: Not applicable - Zostavax vaccine: refused  SCREENING: -Mammogram: Not applicable  - Colonoscopy: Not applicable  - Bone Density: refused -Hearing Test: Not applicable  -Spirometry: Not applicable   PATIENT COUNSELING:   Advised to take 1 mg of folate supplement per day if capable of pregnancy.   Sexuality: Discussed sexually transmitted diseases, partner selection,  use of condoms, avoidance of unintended pregnancy  and contraceptive alternatives.   Advised to avoid cigarette smoking.  I discussed with the patient that most people either abstain from alcohol or drink within safe limits (<=14/week and <=4 drinks/occasion for males, <=7/weeks and <= 3 drinks/occasion for females) and that the risk for alcohol disorders and other health effects rises proportionally with the number of drinks per week and how often a drinker exceeds daily limits.  Discussed cessation/primary prevention of drug use and availability of treatment for abuse.   Diet: Encouraged to adjust caloric intake to maintain  or achieve ideal body weight, to reduce intake of dietary saturated fat and total fat, to limit sodium intake by avoiding high sodium foods and not adding table salt, and to maintain adequate dietary potassium and calcium preferably from fresh fruits, vegetables, and low-fat dairy products.    Stressed the importance of regular exercise  Injury prevention: Discussed safety belts, safety helmets, smoke detector, smoking near bedding or upholstery.   Dental health: Discussed importance of regular tooth brushing, flossing, and dental visits.    NEXT PREVENTATIVE PHYSICAL DUE IN 1 YEAR. Return in about 6 months (around 11/04/2023) for HTN/HLD, CKD, Thyroid .

## 2023-05-08 LAB — T4, FREE: Free T4: 1.34 ng/dL (ref 0.82–1.77)

## 2023-05-08 LAB — VITAMIN D 25 HYDROXY (VIT D DEFICIENCY, FRACTURES): Vit D, 25-Hydroxy: 34.5 ng/mL (ref 30.0–100.0)

## 2023-05-08 LAB — TSH: TSH: 4.28 u[IU]/mL (ref 0.450–4.500)

## 2023-05-08 NOTE — Progress Notes (Signed)
 Good morning, please let Sara Cannon know her labs have returned and continue to look fantastic.  No changes in medicine needed.  Have a wonderful day and keep on inspiring.   Keep being awesome!!  Thank you for allowing me to participate in your care.  I appreciate you. Kindest regards, Venessa Wickham

## 2023-05-10 DIAGNOSIS — R079 Chest pain, unspecified: Secondary | ICD-10-CM | POA: Diagnosis not present

## 2023-05-13 DIAGNOSIS — I129 Hypertensive chronic kidney disease with stage 1 through stage 4 chronic kidney disease, or unspecified chronic kidney disease: Secondary | ICD-10-CM | POA: Diagnosis not present

## 2023-05-13 DIAGNOSIS — I10 Essential (primary) hypertension: Secondary | ICD-10-CM | POA: Diagnosis not present

## 2023-05-13 DIAGNOSIS — N1832 Chronic kidney disease, stage 3b: Secondary | ICD-10-CM | POA: Diagnosis not present

## 2023-05-13 DIAGNOSIS — E875 Hyperkalemia: Secondary | ICD-10-CM | POA: Diagnosis not present

## 2023-07-29 ENCOUNTER — Ambulatory Visit (INDEPENDENT_AMBULATORY_CARE_PROVIDER_SITE_OTHER): Payer: Self-pay | Admitting: Nurse Practitioner

## 2023-07-29 ENCOUNTER — Encounter: Payer: Self-pay | Admitting: Nurse Practitioner

## 2023-07-29 ENCOUNTER — Ambulatory Visit: Payer: Self-pay

## 2023-07-29 VITALS — BP 125/69 | HR 73 | Temp 97.7°F | Resp 15 | Wt 147.4 lb

## 2023-07-29 DIAGNOSIS — R0981 Nasal congestion: Secondary | ICD-10-CM

## 2023-07-29 LAB — VERITOR FLU A/B WAIVED
Influenza A: NEGATIVE
Influenza B: NEGATIVE

## 2023-07-29 MED ORDER — AMOXICILLIN 500 MG PO CAPS: 500.0000 mg | ORAL_CAPSULE | Freq: Two times a day (BID) | ORAL | 0 refills | Status: AC

## 2023-07-29 NOTE — Progress Notes (Signed)
 BP 125/69 (BP Location: Left Arm, Patient Position: Sitting, Cuff Size: Normal)   Pulse 73   Temp 97.7 F (36.5 C) (Oral)   Resp 15   Wt 147 lb 6.4 oz (66.9 kg)   LMP  (LMP Unknown)   SpO2 98%   BMI 25.14 kg/m    Subjective:    Patient ID: Sara Cannon, female    DOB: 1933-02-10, 89 y.o.   MRN: [id number]  HPI: Sara Cannon is a 89 y.o. female  Chief Complaint  Patient presents with   URI    Horseness, tightness, gunk. Negative for fevers, nausea, vomiting, headaches, runny nose, or congestion.    UPPER RESPIRATORY TRACT INFECTION Worst symptom: Symptoms started on Sunday. Took 5 tabs of amoxicillin and thought it knocked it out but symptoms came back.  Fever: no Cough: yes Shortness of breath: no Wheezing: yes Chest pain: no Chest tightness: no Chest congestion: no Nasal congestion: yes Runny nose: yes Post nasal drip: yes Sneezing: no Sore throat: no Swollen glands: no Sinus pressure: no Headache: no Face pain: no Toothache: no Ear pain: no bilateral Ear pressure: no bilateral Eyes red/itching:no Eye drainage/crusting: no  Vomiting: no Rash: no Fatigue: no Sick contacts: no Strep contacts: no  Context: better Recurrent sinusitis: no Relief with OTC cold/cough medications: no  Treatments attempted: none   Relevant past medical, surgical, family and social history reviewed and updated as indicated. Interim medical history since our last visit reviewed. Allergies and medications reviewed and updated.  Review of Systems  Constitutional:  Negative for fatigue and fever.  HENT:  Positive for congestion, postnasal drip and rhinorrhea. Negative for dental problem, ear pain, sinus pressure, sinus pain, sneezing and sore throat.   Respiratory:  Positive for cough and wheezing. Negative for shortness of breath.   Cardiovascular:  Negative for chest pain.  Gastrointestinal:  Negative for vomiting.  Skin:  Negative for rash.  Neurological:  Negative for  headaches.    Per HPI unless specifically indicated above     Objective:    BP 125/69 (BP Location: Left Arm, Patient Position: Sitting, Cuff Size: Normal)   Pulse 73   Temp 97.7 F (36.5 C) (Oral)   Resp 15   Wt 147 lb 6.4 oz (66.9 kg)   LMP  (LMP Unknown)   SpO2 98%   BMI 25.14 kg/m   Wt Readings from Last 3 Encounters:  07/29/23 147 lb 6.4 oz (66.9 kg)  05/07/23 147 lb 9.6 oz (67 kg)  11/01/22 145 lb (65.8 kg)    Physical Exam Vitals and nursing note reviewed.  Constitutional:      General: She is not in acute distress.    Appearance: Normal appearance. She is normal weight. She is not ill-appearing, toxic-appearing or diaphoretic.  HENT:     Head: Normocephalic.     Right Ear: Tympanic membrane and external ear normal.     Left Ear: Tympanic membrane and external ear normal.     Nose: Congestion and rhinorrhea present.     Right Sinus: No maxillary sinus tenderness or frontal sinus tenderness.     Left Sinus: No maxillary sinus tenderness or frontal sinus tenderness.     Mouth/Throat:     Mouth: Mucous membranes are moist.     Pharynx: Oropharynx is clear. Posterior oropharyngeal erythema present. No oropharyngeal exudate.  Eyes:     General:        Right eye: No discharge.  Left eye: No discharge.     Extraocular Movements: Extraocular movements intact.     Conjunctiva/sclera: Conjunctivae normal.     Pupils: Pupils are equal, round, and reactive to light.  Cardiovascular:     Rate and Rhythm: Normal rate and regular rhythm.     Heart sounds: No murmur heard. Pulmonary:     Effort: Pulmonary effort is normal. No respiratory distress.     Breath sounds: Normal breath sounds. No wheezing or rales.  Musculoskeletal:     Cervical back: Normal range of motion and neck supple.  Skin:    General: Skin is warm and dry.     Capillary Refill: Capillary refill takes less than 2 seconds.  Neurological:     General: No focal deficit present.     Mental Status:  She is alert and oriented to person, place, and time. Mental status is at baseline.  Psychiatric:        Mood and Affect: Mood normal.        Behavior: Behavior normal.        Thought Content: Thought content normal.        Judgment: Judgment normal.     Results for orders placed or performed in visit on 05/07/23  T4, free   Collection Time: 05/07/23  2:27 PM  Result Value Ref Range   Free T4 1.34 0.82 - 1.77 ng/dL  TSH   Collection Time: 05/07/23  2:27 PM  Result Value Ref Range   TSH 4.280 0.450 - 4.500 uIU/mL  VITAMIN D 25 Hydroxy (Vit-D Deficiency, Fractures)   Collection Time: 05/07/23  2:27 PM  Result Value Ref Range   Vit D, 25-Hydroxy 34.5 30.0 - 100.0 ng/mL      Assessment & Plan:   Problem List Items Addressed This Visit   None Visit Diagnoses       Nasal congestion    -  Primary   Will treat with amoxicllin.  patient has already taken 5 pills.  Will give 5 more days due to already having taken 5 pills.  Follow up if not improved.   Relevant Orders   Novel Coronavirus, NAA ([facility])   Veritor Flu A/B Waived        Follow up plan: No follow-ups on file.

## 2023-07-29 NOTE — Telephone Encounter (Signed)
  Chief Complaint: Sore throat - hoarse voice Symptoms: above Frequency: Sunday night Pertinent Negatives: Patient denies fever Disposition: [] ED /[] Urgent Care (no appt availability in office) / [x] Appointment(In office/virtual)/ []  [facility]/ [] Home Care/ [] Refused Recommended Disposition /[] [facility]/ []  Follow-up with PCP Additional Notes: Pt states that she started with a sore throat Sunday night. Pt had some "leftover abx" from a root canal. She took 2 on Sunday and 3 Monday. She thinks they were helpful, but is out of this medication now. PT has sore throat, cough and is hoarse.  Appt for this afternoon.    Summary: cough, sore throat   Copied From CRM [id number]. Reason for Triage: Cough, sore throat     Reason for Disposition  [1] Sore throat is the only symptom AND [2] present > 48 hours  Answer Assessment - Initial Assessment Questions 1. ONSET: "When did the throat start hurting?" (Hours or days ago)      Sunday night 2. SEVERITY: "How bad is the sore throat?" (Scale 1-10; mild, moderate or severe)   - MILD (1-3):  Doesn't interfere with eating or normal activities.   - MODERATE (4-7): Interferes with eating some solids and normal activities.   - SEVERE (8-10):  Excruciating pain, interferes with most normal activities.   - SEVERE WITH DYSPHAGIA (10): Can't swallow liquids, drooling.     moderate 3. STREP EXPOSURE: "Has there been any exposure to strep within the past week?" If Yes, ask: "What type of contact occurred?"      no 4.  VIRAL SYMPTOMS: "Are there any symptoms of a cold, such as a runny nose, cough, hoarse voice or red eyes?"      Cough - hoarse voice 5. FEVER: "Do you have a fever?" If Yes, ask: "What is your temperature, how was it measured, and when did it start?"     no  Protocols used: Sore Throat-A-AH

## 2023-07-31 LAB — NOVEL CORONAVIRUS, NAA: SARS-CoV-2, NAA: NOT DETECTED

## 2023-10-06 ENCOUNTER — Telehealth: Payer: Self-pay

## 2023-10-06 NOTE — Telephone Encounter (Signed)
 error

## 2023-10-22 DIAGNOSIS — I129 Hypertensive chronic kidney disease with stage 1 through stage 4 chronic kidney disease, or unspecified chronic kidney disease: Secondary | ICD-10-CM | POA: Diagnosis not present

## 2023-10-22 DIAGNOSIS — N1832 Chronic kidney disease, stage 3b: Secondary | ICD-10-CM | POA: Diagnosis not present

## 2023-10-22 DIAGNOSIS — I10 Essential (primary) hypertension: Secondary | ICD-10-CM | POA: Diagnosis not present

## 2023-10-22 DIAGNOSIS — R8290 Unspecified abnormal findings in urine: Secondary | ICD-10-CM | POA: Diagnosis not present

## 2023-10-22 DIAGNOSIS — E875 Hyperkalemia: Secondary | ICD-10-CM | POA: Diagnosis not present

## 2023-10-29 DIAGNOSIS — I10 Essential (primary) hypertension: Secondary | ICD-10-CM | POA: Diagnosis not present

## 2023-10-29 DIAGNOSIS — E875 Hyperkalemia: Secondary | ICD-10-CM | POA: Diagnosis not present

## 2023-10-29 DIAGNOSIS — I129 Hypertensive chronic kidney disease with stage 1 through stage 4 chronic kidney disease, or unspecified chronic kidney disease: Secondary | ICD-10-CM | POA: Diagnosis not present

## 2023-10-29 DIAGNOSIS — N2581 Secondary hyperparathyroidism of renal origin: Secondary | ICD-10-CM | POA: Diagnosis not present

## 2023-10-29 DIAGNOSIS — N1832 Chronic kidney disease, stage 3b: Secondary | ICD-10-CM | POA: Diagnosis not present

## 2023-10-29 DIAGNOSIS — N261 Atrophy of kidney (terminal): Secondary | ICD-10-CM | POA: Diagnosis not present

## 2023-10-29 DIAGNOSIS — E871 Hypo-osmolality and hyponatremia: Secondary | ICD-10-CM | POA: Diagnosis not present

## 2023-11-09 NOTE — Patient Instructions (Incomplete)

## 2023-11-12 ENCOUNTER — Ambulatory Visit (INDEPENDENT_AMBULATORY_CARE_PROVIDER_SITE_OTHER): Payer: Self-pay | Admitting: Nurse Practitioner

## 2023-11-12 ENCOUNTER — Encounter: Payer: Self-pay | Admitting: Nurse Practitioner

## 2023-11-12 VITALS — BP 132/68 | HR 72 | Temp 97.9°F | Ht 64.2 in | Wt 145.4 lb

## 2023-11-12 DIAGNOSIS — E875 Hyperkalemia: Secondary | ICD-10-CM

## 2023-11-12 DIAGNOSIS — I129 Hypertensive chronic kidney disease with stage 1 through stage 4 chronic kidney disease, or unspecified chronic kidney disease: Secondary | ICD-10-CM

## 2023-11-12 DIAGNOSIS — M810 Age-related osteoporosis without current pathological fracture: Secondary | ICD-10-CM

## 2023-11-12 DIAGNOSIS — E039 Hypothyroidism, unspecified: Secondary | ICD-10-CM | POA: Diagnosis not present

## 2023-11-12 DIAGNOSIS — N1832 Chronic kidney disease, stage 3b: Secondary | ICD-10-CM | POA: Diagnosis not present

## 2023-11-12 MED ORDER — RALOXIFENE HCL 60 MG PO TABS: ORAL_TABLET | ORAL | 4 refills | Status: AC

## 2023-11-12 MED ORDER — LEVOTHYROXINE SODIUM 75 MCG PO TABS: 75.0000 ug | ORAL_TABLET | Freq: Every day | ORAL | 4 refills | Status: AC

## 2023-11-12 NOTE — Assessment & Plan Note (Signed)
 Ongoing and stable, labs up to date with nephrology.  Continue Veltassa per nephrology.

## 2023-11-12 NOTE — Assessment & Plan Note (Signed)
 Chronic, ongoing.  Continue Evista .  Patient declines a repeat DEXA at this time.  Vit D level up to date.

## 2023-11-12 NOTE — Assessment & Plan Note (Signed)
 Chronic, stable.  BP at goal in office.  Continue current medication regimen and collaboration with nephrology.  Labs up to date with nephrology.  Recommend continue to monitor BP at home regularly, focus on DASH diet, and avoid nephrotoxic medications.  Return in 6 months.

## 2023-11-12 NOTE — Progress Notes (Signed)
 BP 132/68 (BP Location: Left Arm, Patient Position: Sitting, Cuff Size: Normal)   Pulse 72   Temp 97.9 F (36.6 C) (Oral)   Ht 5' 4.2 (1.631 m)   Wt 145 lb 6.4 oz (66 kg)   LMP  (LMP Unknown)   SpO2 98%   BMI 24.80 kg/m    Subjective:    Patient ID: Sara Cannon, female    DOB: 1932/11/05, 89 y.o.   MRN: [id number]  HPI: Sara Cannon is a 89 y.o. female  Chief Complaint  Patient presents with   Chronic Kidney Disease   Hyperlipidemia   Hypertension   Hypothyroidism   HYPOTHYROIDISM Continues on Levothyroxine  75 MCG daily. Last thyroid  labs stable in January 2025. Thyroid  control status:stable Satisfied with current treatment? yes Medication side effects: no Medication compliance: good compliance Etiology of hypothyroidism:  Recent dose adjustment:no Fatigue: no Cold intolerance: no Heat intolerance: no Weight gain: no Weight loss: no Constipation: at baseline Diarrhea/loose stools: no Palpitations: no Lower extremity edema: no Anxiety/depressed mood: no    HYPERTENSION Takes Amlodipine 5 MG daily and Veltassa. Hypertension status: stable  Satisfied with current treatment? yes Duration of hypertension: chronic BP monitoring frequency:  none BP range:  none BP medication side effects:  no Medication compliance: good compliance Previous BP meds: Aspirin: yes Recurrent headaches: no Visual changes: no Palpitations: no Dyspnea: no Chest pain: no Lower extremity edema: no Dizzy/lightheaded: no    CHRONIC KIDNEY DISEASE (Stage 3b) Follows with nephrology, last visit was 10/29/23, labs on 10/22/23 showed CRT 1.26, eGFR 41, BUN 25, PTH 74, K+ 4.5, and H/H 11.5/36. CKD status: stable Medications renally dose: yes Previous renal evaluation: yes Pneumovax:  Up to Date Influenza Vaccine:  Up to Date   OSTEOPOROSIS Continues on Evista  and Vitamin D3.  DEXA last in 2009, unable to view results up on Epic. Refuses to obtain a repeat DEXA, have had many  discussions with her on this. No recent falls.  Is going to the mall on occasion to walk, as [employer] is on hold for the fall. Satisfied with current treatment?: yes Medication side effects: no Medication compliance: good compliance Past osteoporosis medications/treatments:  Adequate calcium & vitamin D : yes Intolerance to bisphosphonates:no Weight bearing exercises: yes  Relevant past medical, surgical, family and social history reviewed and updated as indicated. Interim medical history since our last visit reviewed. Allergies and medications reviewed and updated.  Review of Systems  Constitutional:  Negative for activity change, appetite change, chills, fatigue and unexpected weight change.  Respiratory:  Negative for cough, chest tightness, shortness of breath and wheezing.   Cardiovascular:  Negative for chest pain, palpitations and leg swelling.  Gastrointestinal:  Positive for constipation (baseline).  Endocrine: Negative for cold intolerance and heat intolerance.  Neurological: Negative.   Psychiatric/Behavioral: Negative.      Per HPI unless specifically indicated above     Objective:    BP 132/68 (BP Location: Left Arm, Patient Position: Sitting, Cuff Size: Normal)   Pulse 72   Temp 97.9 F (36.6 C) (Oral)   Ht 5' 4.2 (1.631 m)   Wt 145 lb 6.4 oz (66 kg)   LMP  (LMP Unknown)   SpO2 98%   BMI 24.80 kg/m   Wt Readings from Last 3 Encounters:  11/12/23 145 lb 6.4 oz (66 kg)  07/29/23 147 lb 6.4 oz (66.9 kg)  05/07/23 147 lb 9.6 oz (67 kg)    Physical Exam Vitals and nursing note reviewed.  Constitutional:      General: She is awake. She is not in acute distress.    Appearance: She is well-developed and well-groomed. She is not ill-appearing or toxic-appearing.  HENT:     Head: Normocephalic.     Right Ear: Hearing and external ear normal.     Left Ear: Hearing and external ear normal.  Eyes:     General: Lids are normal.        Right eye: No  discharge.        Left eye: No discharge.     Conjunctiva/sclera: Conjunctivae normal.     Pupils: Pupils are equal, round, and reactive to light.  Neck:     Thyroid : No thyromegaly.     Vascular: No carotid bruit.  Cardiovascular:     Rate and Rhythm: Normal rate and regular rhythm.     Heart sounds: Normal heart sounds. No murmur heard.    No gallop.  Pulmonary:     Effort: Pulmonary effort is normal. No accessory muscle usage or respiratory distress.     Breath sounds: Normal breath sounds. No decreased breath sounds, wheezing or rhonchi.  Abdominal:     General: Bowel sounds are normal. There is no distension.     Palpations: Abdomen is soft.     Tenderness: There is no abdominal tenderness.  Musculoskeletal:     Cervical back: Normal range of motion and neck supple.     Right lower leg: No edema.     Left lower leg: No edema.  Lymphadenopathy:     Cervical: No cervical adenopathy.  Skin:    General: Skin is warm and dry.  Neurological:     Mental Status: She is alert and oriented to person, place, and time.     Deep Tendon Reflexes: Reflexes are normal and symmetric.     Reflex Scores:      Brachioradialis reflexes are 2+ on the right side and 2+ on the left side.      Patellar reflexes are 2+ on the right side and 2+ on the left side. Psychiatric:        Attention and Perception: Attention normal.        Mood and Affect: Mood normal.        Speech: Speech normal.        Behavior: Behavior normal. Behavior is cooperative.        Thought Content: Thought content normal.    Results for orders placed or performed in visit on 07/29/23  Veritor Flu A/B Waived   Collection Time: 07/29/23  1:20 PM  Result Value Ref Range   Influenza A Negative Negative   Influenza B Negative Negative  Novel Coronavirus, NAA ([facility])   Collection Time: 07/29/23  1:25 PM   Specimen: Nasopharyngeal(NP) swabs in vial transport medium  Result Value Ref Range   SARS-CoV-2, NAA Not Detected  Not Detected      Assessment & Plan:   Problem List Items Addressed This Visit       Endocrine   Hypothyroidism   Chronic, ongoing.  Continue current medication regimen and adjust as needed based on TSH.  Labs: up to date.  Return in 6 months.      Relevant Medications   levothyroxine  (SYNTHROID ) 75 MCG tablet     Musculoskeletal and Integument   Osteoporosis   Chronic, ongoing.  Continue Evista .  Patient declines a repeat DEXA at this time.  Vit D level up to date.  Relevant Medications   raloxifene  (EVISTA ) 60 MG tablet     Genitourinary   Hypertensive chronic kidney disease   Chronic, stable.  BP at goal in office.  Continue current medication regimen and collaboration with nephrology.  Labs up to date with nephrology.  Recommend continue to monitor BP at home regularly, focus on DASH diet, and avoid nephrotoxic medications.  Return in 6 months.      CKD (chronic kidney disease) stage 3, GFR 30-59 ml/min (HCC) - Primary   Chronic, ongoing.  Continue collaboration with nephrology.  Labs up to date and reviewed. Will continue to review their notes and labs from nephrology team.        Other   Hyperkalemia (Chronic)   Ongoing and stable, labs up to date with nephrology.  Continue Veltassa per nephrology.        Follow up plan: Return in about 6 months (around 05/14/2024) for HTN, THYROID , CKD.

## 2023-11-12 NOTE — Assessment & Plan Note (Signed)
 Chronic, ongoing.  Continue collaboration with nephrology.  Labs up to date and reviewed. Will continue to review their notes and labs from nephrology team.

## 2023-11-12 NOTE — Assessment & Plan Note (Signed)
 Chronic, ongoing.  Continue current medication regimen and adjust as needed based on TSH.  Labs: up to date.  Return in 6 months.

## 2024-03-13 DIAGNOSIS — N1832 Chronic kidney disease, stage 3b: Secondary | ICD-10-CM | POA: Diagnosis not present

## 2024-03-13 DIAGNOSIS — I10 Essential (primary) hypertension: Secondary | ICD-10-CM | POA: Diagnosis not present

## 2024-03-13 DIAGNOSIS — I129 Hypertensive chronic kidney disease with stage 1 through stage 4 chronic kidney disease, or unspecified chronic kidney disease: Secondary | ICD-10-CM | POA: Diagnosis not present

## 2024-05-15 NOTE — Patient Instructions (Signed)
 Be Involved in Caring For Your Health:  Taking Medications When medications are taken as directed, they can greatly improve your health. But if they are not taken as prescribed, they may not work. In some cases, not taking them correctly can be harmful. To help ensure your treatment remains effective and safe, understand your medications and how to take them. Bring your medications to each visit for review by your provider.  Your lab results, notes, and after visit summary will be available on My Chart. We strongly encourage you to use this feature. If lab results are abnormal the clinic will contact you with the appropriate steps. If the clinic does not contact you assume the results are satisfactory. You can always view your results on My Chart. If you have questions regarding your health or results, please contact the clinic during office hours. You can also ask questions on My Chart.  We at [facility] are grateful that you chose [place] to provide your care. We strive to provide evidence-based and compassionate care and are always looking for feedback. If you get a survey from the clinic please complete this so we can hear your opinions.  Chronic Kidney Disease: Eating Plan Chronic kidney disease (CKD) is when your kidneys aren't working well. They can't remove waste, fluids, and other substances from your blood. When these substances build up, they can worsen kidney damage and affect your health. Eating certain foods can lead to a buildup of these substances. Changing your diet can help prevent more kidney damage. Diet changes may also delay dialysis or even keep you from needing it. What nutrients should I limit? Work with your health care team and an expert in healthy eating (dietitian) to make a meal plan that's right for you. Foods you can eat and foods you should limit or avoid will depend on: The stage of your kidney disease. Any other conditions you have. The items listed below  are not a complete list. Talk with a dietitian to learn what's best for you. Potassium Potassium affects how well your heart beats. Too much potassium in your blood can cause an irregular heartbeat or even a heart attack. You may need to limit foods that are high in potassium, such as: Liquid milk and soy milk. Salt substitutes that contain potassium. Fruits like: Bananas. Apricots. Melon. Prunes and raisins. Kiwi. Nectarines and oranges. Vegetables, such as: Potatoes, sweet potatoes, and yams. Tomatoes. Leafy greens. Beets. Avocado. Pumpkin and winter squash. Beans, like lima beans. Nuts. Phosphorus Phosphorus is a mineral found in your bones. You need a balance between calcium and phosphorus to build and maintain healthy bones.  Too much added phosphorus from the foods you eat can pull calcium from your bones. Losing calcium can make your bones weak and more likely to break. Too much phosphorus can also make your skin itch. You may need to limit foods that are high in phosphorus or that have added phosphorus, such as: Liquid milk and dairy products. Dark-colored sodas or soft drinks. Bran cereals and oatmeal. Protein  Protein helps your body make and keep muscle. Protein also helps to repair your body's cells and tissues.  One of the natural breakdown products of protein is a waste product called urea. When your kidneys aren't working well, they can't remove waste like urea. Reducing protein in your diet can help keep urea from building up in your blood. Depending on your stage of kidney disease, you may need to eat smaller portions of foods that  are high in protein. Sources of animal protein include: Meat (all types). Fish and seafood. Poultry. Eggs. Dairy. Other protein foods include: Beans and legumes. Nuts and nut butter. Soy, like tofu.  Sodium Salt (sodium) helps to keep a healthy balance of fluids in your body. Too much salt can increase your blood pressure,  which can harm your heart and lungs. Extra salt can also cause your body to keep too much fluid, making your kidneys work harder. You may need to limit or avoid foods that are high in salt, such as: Salt seasonings. Soy and teriyaki sauce. Meats that are: Packaged. Precooked. Cured. Processed. Salted crackers and snack foods. Fast food. Canned soups and foods. Pickled foods. Boxed mixes or ready-to-eat boxed meals and side dishes. Bottled dressings, sauces, and marinades. Talk with your dietitian about how much potassium, phosphorus, protein, and salt you may have each day. What are tips for following this plan? Reading food labels  Check the amount of salt in foods. Limit foods that have salt listed among the first five ingredients. Try to eat low-salt foods. Check the ingredient list for added phosphorus or potassium. "Phos" in an ingredient is a sign that phosphorus has been added. Do not buy foods that are calcium-enriched or that have calcium added to them (fortified). Buy canned vegetables and beans that say "no salt added." Rinse them before eating. Lifestyle Limit the amount of protein you eat from animal sources each day. Focus on protein from plant sources, like tofu and dried beans, peas, and lentils. Do not add salt to food when cooking or before eating. Do not eat star fruit. It can be toxic for people with kidney problems. Talk with your health care provider before taking any vitamin or mineral supplements. If told by your provider: Track how much liquid you have so you can avoid drinking too much. Try to eat foods that are made mostly from water, like gelatin, ice cream, soups, and juicy fruits and vegetables. If you have diabetes and chronic kidney disease: If you have diabetes and CKD, you need to keep your blood sugar (glucose) in the target range recommended by your provider. Follow your diabetes management plan. This may include: Checking your blood glucose  regularly. Taking medicines by mouth, or taking insulin, or both. Exercising for at least 30 minutes on 5 or more days each week, or as told by your provider. Tracking how many servings of carbohydrates you eat at each meal. Not using orange juice to treat low blood sugars. Instead, use apple juice, cranberry juice, or clear soda. You may be given guidelines on what foods and nutrients you may eat, and how much you can have each day. This depends on your stage of kidney disease and whether you have high blood pressure. Follow the meal plan your dietitian gives you. Where to find more information [employer] of Diabetes and Digestive and Kidney Diseases: [employer].si National Kidney Foundation: kidney.org This information is not intended to replace advice given to you by your health care provider. Make sure you discuss any questions you have with your health care provider. Document Revised: 11/26/2022 Document Reviewed: 11/26/2022 Elsevier Patient Education  2024 [employer].

## 2024-05-19 ENCOUNTER — Ambulatory Visit: Admitting: Nurse Practitioner

## 2024-05-19 ENCOUNTER — Encounter: Payer: Self-pay | Admitting: Nurse Practitioner

## 2024-05-19 VITALS — BP 132/71 | HR 89 | Temp 99.4°F | Ht 64.1 in | Wt 148.5 lb

## 2024-05-19 DIAGNOSIS — M810 Age-related osteoporosis without current pathological fracture: Secondary | ICD-10-CM | POA: Diagnosis not present

## 2024-05-19 DIAGNOSIS — N1832 Chronic kidney disease, stage 3b: Secondary | ICD-10-CM

## 2024-05-19 DIAGNOSIS — E039 Hypothyroidism, unspecified: Secondary | ICD-10-CM

## 2024-05-19 DIAGNOSIS — Z23 Encounter for immunization: Secondary | ICD-10-CM

## 2024-05-19 DIAGNOSIS — E875 Hyperkalemia: Secondary | ICD-10-CM

## 2024-05-19 DIAGNOSIS — I129 Hypertensive chronic kidney disease with stage 1 through stage 4 chronic kidney disease, or unspecified chronic kidney disease: Secondary | ICD-10-CM

## 2024-05-19 NOTE — Assessment & Plan Note (Signed)
 Chronic, ongoing.  Continue collaboration with nephrology.  Labs up to date and reviewed. Will continue to review their notes and labs from nephrology team.

## 2024-05-19 NOTE — Assessment & Plan Note (Signed)
 Ongoing and stable, labs up to date with nephrology.  Continue Veltassa per nephrology.

## 2024-05-19 NOTE — Assessment & Plan Note (Signed)
 Chronic, stable.  BP at goal in office.  Continue current medication regimen and collaboration with nephrology.  Labs up to date with nephrology.  Recommend continue to monitor BP at home regularly, focus on DASH diet, and avoid nephrotoxic medications.  Return in 6 months.

## 2024-05-19 NOTE — Assessment & Plan Note (Signed)
 Chronic, ongoing.  Continue Evista .  Patient declines a repeat DEXA at this time.  Labs up to date.

## 2024-05-19 NOTE — Assessment & Plan Note (Signed)
 Chronic, ongoing.  Continue current medication regimen and adjust as needed based on TSH.  Labs: up to date.  Return in 6 months.

## 2024-05-19 NOTE — Progress Notes (Signed)
 "  BP 132/71   Pulse 89   Temp 99.4 F (37.4 C) (Oral)   Ht 5' 4.1 (1.628 m)   Wt 148 lb 8 oz (67.4 kg)   LMP  (LMP Unknown)   SpO2 98%   BMI 25.41 kg/m    Subjective:    Patient ID: Sara Cannon, female    DOB: 1932/10/09, 89 y.o.   MRN: [id number]  HPI: Sara Cannon is a 89 y.o. female  Chief Complaint  Patient presents with   Chronic Kidney Disease   Hypertension   Hypothyroidism   HYPERTENSION / HYPERLIPIDEMIA Taking Amlodipine at this time. Satisfied with current treatment? yes Duration of hypertension: chronic BP monitoring frequency: not checking BP range:  BP medication side effects: no Duration of hyperlipidemia: chronic Aspirin: yes Recent stressors: no Recurrent headaches: no Visual changes: no Palpitations: no Dyspnea: no Chest pain: no Lower extremity edema: no Dizzy/lightheaded: no   HYPOTHYROIDISM Taking Levothyroxine  25 MCG daily. Recent level with nephrology 3.27. Thyroid  control status:stable Satisfied with current treatment? yes Medication side effects: no Medication compliance: good compliance Etiology of hypothyroidism:  Recent dose adjustment:no Fatigue: no Cold intolerance: no Heat intolerance: no Weight gain: no Weight loss: no Constipation: does stool softener every day Diarrhea/loose stools: no Palpitations: no Lower extremity edema: no Anxiety/depressed mood: no   OSTEOPOROSIS Takes Evista . Has not had a bone density since 2009 and refuses to repeat. Satisfied with current treatment?: yes Medication side effects: no Medication compliance: good compliance Past osteoporosis medications/treatments:  Adequate calcium & vitamin D : yes Intolerance to bisphosphonates:no Weight bearing exercises: yes   CHRONIC KIDNEY DISEASE Last saw nephrology on 05/05/24. CRT 1.15 and eGFR 46, H/H 11.4/35.9. Continues Veltassa. CKD status: stable Medications renally dose: yes Previous renal evaluation: yes Pneumovax:  Up to  Date Influenza Vaccine:  refuses   Relevant past medical, surgical, family and social history reviewed and updated as indicated. Interim medical history since our last visit reviewed. Allergies and medications reviewed and updated.  Review of Systems  Constitutional:  Negative for activity change, appetite change, diaphoresis, fatigue and fever.  Respiratory:  Negative for cough, chest tightness and shortness of breath.   Cardiovascular:  Negative for chest pain, palpitations and leg swelling.  Gastrointestinal: Negative.   Endocrine: Negative for cold intolerance and heat intolerance.  Neurological: Negative.   Psychiatric/Behavioral: Negative.     Per HPI unless specifically indicated above     Objective:    BP 132/71   Pulse 89   Temp 99.4 F (37.4 C) (Oral)   Ht 5' 4.1 (1.628 m)   Wt 148 lb 8 oz (67.4 kg)   LMP  (LMP Unknown)   SpO2 98%   BMI 25.41 kg/m   Wt Readings from Last 3 Encounters:  05/19/24 148 lb 8 oz (67.4 kg)  11/12/23 145 lb 6.4 oz (66 kg)  07/29/23 147 lb 6.4 oz (66.9 kg)    Physical Exam Vitals and nursing note reviewed.  Constitutional:      General: She is awake. She is not in acute distress.    Appearance: She is well-developed and well-groomed. She is not ill-appearing or toxic-appearing.  HENT:     Head: Normocephalic.     Right Ear: Hearing and external ear normal.     Left Ear: Hearing and external ear normal.  Eyes:     General: Lids are normal.        Right eye: No discharge.        Left  eye: No discharge.     Conjunctiva/sclera: Conjunctivae normal.     Pupils: Pupils are equal, round, and reactive to light.  Neck:     Thyroid : No thyromegaly.     Vascular: No carotid bruit.  Cardiovascular:     Rate and Rhythm: Normal rate and regular rhythm.     Heart sounds: Normal heart sounds. No murmur heard.    No gallop.  Pulmonary:     Effort: Pulmonary effort is normal. No accessory muscle usage or respiratory distress.     Breath  sounds: Normal breath sounds. No decreased breath sounds, wheezing or rales.  Abdominal:     General: Bowel sounds are normal. There is no distension.     Palpations: Abdomen is soft.     Tenderness: There is no abdominal tenderness.  Musculoskeletal:     Cervical back: Normal range of motion and neck supple.     Right lower leg: No edema.     Left lower leg: No edema.  Lymphadenopathy:     Cervical: No cervical adenopathy.  Skin:    General: Skin is warm and dry.  Neurological:     Mental Status: She is alert and oriented to person, place, and time.     Deep Tendon Reflexes: Reflexes are normal and symmetric.     Reflex Scores:      Brachioradialis reflexes are 2+ on the right side and 2+ on the left side.      Patellar reflexes are 2+ on the right side and 2+ on the left side. Psychiatric:        Attention and Perception: Attention normal.        Mood and Affect: Mood normal.        Speech: Speech normal.        Behavior: Behavior normal. Behavior is cooperative.        Thought Content: Thought content normal.     Results for orders placed or performed in visit on 07/29/23  Veritor Flu A/B Waived   Collection Time: 07/29/23  1:20 PM  Result Value Ref Range   Influenza A Negative Negative   Influenza B Negative Negative  Novel Coronavirus, NAA ([facility])   Collection Time: 07/29/23  1:25 PM   Specimen: Nasopharyngeal(NP) swabs in vial transport medium  Result Value Ref Range   SARS-CoV-2, NAA Not Detected Not Detected      Assessment & Plan:   Problem List Items Addressed This Visit       Endocrine   Hypothyroidism   Chronic, ongoing.  Continue current medication regimen and adjust as needed based on TSH.  Labs: up to date.  Return in 6 months.        Musculoskeletal and Integument   Osteoporosis   Chronic, ongoing.  Continue Evista .  Patient declines a repeat DEXA at this time.  Labs up to date.        Genitourinary   Hypertensive chronic kidney disease    Chronic, stable.  BP at goal in office.  Continue current medication regimen and collaboration with nephrology.  Labs up to date with nephrology.  Recommend continue to monitor BP at home regularly, focus on DASH diet, and avoid nephrotoxic medications.  Return in 6 months.      CKD (chronic kidney disease) stage 3, GFR 30-59 ml/min (HCC) - Primary   Chronic, ongoing.  Continue collaboration with nephrology.  Labs up to date and reviewed. Will continue to review their notes and labs from nephrology team.  Other   Hyperkalemia (Chronic)   Ongoing and stable, labs up to date with nephrology.  Continue Veltassa per nephrology.        Follow up plan: Return in about 6 months (around 11/16/2024) for HTN/HLD, CKD, OSTEOPOROSIS, Hypothyroid.      "

## 2024-12-01 ENCOUNTER — Ambulatory Visit: Admitting: Nurse Practitioner
